# Patient Record
Sex: Male | Born: 1955 | Race: Black or African American | Hispanic: No | Marital: Married | Smoking: Never smoker
Health system: Southern US, Community
[De-identification: ages and names within clinical notes are randomized; demographics above are authoritative.]

## PROBLEM LIST (undated history)

## (undated) DIAGNOSIS — E7800 Pure hypercholesterolemia, unspecified: Secondary | ICD-10-CM

## (undated) DIAGNOSIS — I2510 Atherosclerotic heart disease of native coronary artery without angina pectoris: Secondary | ICD-10-CM

## (undated) DIAGNOSIS — I10 Essential (primary) hypertension: Secondary | ICD-10-CM

## (undated) HISTORY — PX: NISSEN FUNDOPLICATION: SHX2091

## (undated) HISTORY — PX: LUNG TRANSPLANT, DOUBLE: SHX704

## (undated) HISTORY — PX: CORONARY ANGIOPLASTY WITH STENT PLACEMENT: SHX49

---

## 2000-02-17 ENCOUNTER — Encounter: Payer: Self-pay | Admitting: Cardiovascular Disease

## 2000-02-17 ENCOUNTER — Encounter: Admission: RE | Admit: 2000-02-17 | Discharge: 2000-02-17 | Payer: Self-pay | Admitting: Cardiovascular Disease

## 2000-02-18 ENCOUNTER — Ambulatory Visit (HOSPITAL_COMMUNITY): Admission: RE | Admit: 2000-02-18 | Discharge: 2000-02-18 | Payer: Self-pay | Admitting: Cardiovascular Disease

## 2000-02-20 ENCOUNTER — Encounter: Payer: Self-pay | Admitting: Cardiovascular Disease

## 2000-02-20 ENCOUNTER — Encounter: Admission: RE | Admit: 2000-02-20 | Discharge: 2000-02-20 | Payer: Self-pay | Admitting: Cardiovascular Disease

## 2000-06-02 ENCOUNTER — Emergency Department (HOSPITAL_COMMUNITY): Admission: EM | Admit: 2000-06-02 | Discharge: 2000-06-02 | Payer: Self-pay | Admitting: Emergency Medicine

## 2000-06-02 ENCOUNTER — Encounter: Payer: Self-pay | Admitting: Emergency Medicine

## 2000-10-06 ENCOUNTER — Ambulatory Visit (HOSPITAL_COMMUNITY): Admission: RE | Admit: 2000-10-06 | Discharge: 2000-10-06 | Payer: Self-pay | Admitting: Cardiovascular Disease

## 2000-10-06 ENCOUNTER — Encounter: Payer: Self-pay | Admitting: Cardiovascular Disease

## 2001-01-23 ENCOUNTER — Emergency Department (HOSPITAL_COMMUNITY): Admission: EM | Admit: 2001-01-23 | Discharge: 2001-01-23 | Payer: Self-pay

## 2002-02-10 ENCOUNTER — Ambulatory Visit (HOSPITAL_COMMUNITY): Admission: RE | Admit: 2002-02-10 | Discharge: 2002-02-10 | Payer: Self-pay | Admitting: Pulmonary Disease

## 2002-02-10 ENCOUNTER — Encounter: Payer: Self-pay | Admitting: Pulmonary Disease

## 2004-09-05 ENCOUNTER — Emergency Department (HOSPITAL_COMMUNITY): Admission: EM | Admit: 2004-09-05 | Discharge: 2004-09-05 | Payer: Self-pay | Admitting: Emergency Medicine

## 2006-01-05 ENCOUNTER — Emergency Department (HOSPITAL_COMMUNITY): Admission: EM | Admit: 2006-01-05 | Discharge: 2006-01-05 | Payer: Self-pay | Admitting: Emergency Medicine

## 2007-02-26 ENCOUNTER — Emergency Department (HOSPITAL_COMMUNITY): Admission: EM | Admit: 2007-02-26 | Discharge: 2007-02-26 | Payer: Self-pay | Admitting: Emergency Medicine

## 2007-06-19 ENCOUNTER — Emergency Department (HOSPITAL_COMMUNITY): Admission: EM | Admit: 2007-06-19 | Discharge: 2007-06-19 | Payer: Self-pay | Admitting: Emergency Medicine

## 2010-10-06 ENCOUNTER — Emergency Department (HOSPITAL_COMMUNITY): Payer: Medicare Other

## 2010-10-06 ENCOUNTER — Emergency Department (HOSPITAL_COMMUNITY)
Admission: EM | Admit: 2010-10-06 | Discharge: 2010-10-06 | Disposition: A | Discharge status: Home / Self Care | Payer: Medicare Other | Attending: Emergency Medicine | Admitting: Emergency Medicine

## 2010-10-06 DIAGNOSIS — Z942 Lung transplant status: Secondary | ICD-10-CM | POA: Insufficient documentation

## 2010-10-06 DIAGNOSIS — M545 Low back pain, unspecified: Secondary | ICD-10-CM | POA: Insufficient documentation

## 2010-10-06 DIAGNOSIS — M549 Dorsalgia, unspecified: Secondary | ICD-10-CM | POA: Insufficient documentation

## 2010-10-06 DIAGNOSIS — Z79899 Other long term (current) drug therapy: Secondary | ICD-10-CM | POA: Insufficient documentation

## 2010-10-06 DIAGNOSIS — R000 Tachycardia, unspecified: Secondary | ICD-10-CM | POA: Insufficient documentation

## 2010-10-06 LAB — URINALYSIS, ROUTINE W REFLEX MICROSCOPIC
Bilirubin Urine: NEGATIVE
Glucose, UA: NEGATIVE mg/dL
Hgb urine dipstick: NEGATIVE
Nitrite: NEGATIVE
Urobilinogen, UA: 0.2 mg/dL (ref 0.0–1.0)
pH: 6.5 (ref 5.0–8.0)

## 2010-12-15 LAB — URINALYSIS, ROUTINE W REFLEX MICROSCOPIC
Glucose, UA: NEGATIVE
Hgb urine dipstick: NEGATIVE
Urobilinogen, UA: 0.2

## 2010-12-15 LAB — DIFFERENTIAL
Basophils Relative: 0
Eosinophils Absolute: 0
Eosinophils Relative: 0
Monocytes Relative: 10
Neutro Abs: 4.4

## 2010-12-15 LAB — COMPREHENSIVE METABOLIC PANEL
ALT: 24
AST: 29
Albumin: 4.2
Calcium: 9.5
GFR calc non Af Amer: 49 — ABNORMAL LOW
Glucose, Bld: 209 — ABNORMAL HIGH
Total Bilirubin: 0.8
Total Protein: 7.5

## 2010-12-15 LAB — CBC
HCT: 37 — ABNORMAL LOW
Hemoglobin: 13.1
MCHC: 35.4
RBC: 3.7 — ABNORMAL LOW

## 2010-12-29 LAB — BASIC METABOLIC PANEL
BUN: 23
Creatinine, Ser: 1.6 — ABNORMAL HIGH
Potassium: 4.7
Sodium: 137

## 2010-12-29 LAB — CBC
Hemoglobin: 12.3 — ABNORMAL LOW
MCHC: 35.5
Platelets: 145 — ABNORMAL LOW
RDW: 12.6
WBC: 3 — ABNORMAL LOW

## 2010-12-29 LAB — DIFFERENTIAL
Basophils Relative: 0
Eosinophils Relative: 1
Lymphs Abs: 0.5 — ABNORMAL LOW
Monocytes Relative: 11
Neutro Abs: 2.1
Neutrophils Relative %: 72

## 2012-09-06 ENCOUNTER — Other Ambulatory Visit: Payer: Self-pay | Admitting: *Deleted

## 2012-09-08 ENCOUNTER — Other Ambulatory Visit: Payer: Self-pay | Admitting: *Deleted

## 2012-09-08 DIAGNOSIS — M818 Other osteoporosis without current pathological fracture: Secondary | ICD-10-CM

## 2012-09-27 ENCOUNTER — Ambulatory Visit
Admission: RE | Admit: 2012-09-27 | Discharge: 2012-09-27 | Disposition: A | Discharge status: Home / Self Care | Payer: Self-pay | Source: Ambulatory Visit | Attending: *Deleted | Admitting: *Deleted

## 2012-09-27 DIAGNOSIS — M818 Other osteoporosis without current pathological fracture: Secondary | ICD-10-CM

## 2014-04-18 ENCOUNTER — Emergency Department (HOSPITAL_COMMUNITY): Payer: Medicare Other

## 2014-04-18 ENCOUNTER — Emergency Department (HOSPITAL_COMMUNITY)
Admission: EM | Admit: 2014-04-18 | Discharge: 2014-04-19 | Disposition: A | Discharge status: Home / Self Care | Payer: Medicare Other | Attending: Emergency Medicine | Admitting: Emergency Medicine

## 2014-04-18 ENCOUNTER — Encounter (HOSPITAL_COMMUNITY): Payer: Self-pay | Admitting: Emergency Medicine

## 2014-04-18 DIAGNOSIS — W009XXA Unspecified fall due to ice and snow, initial encounter: Secondary | ICD-10-CM | POA: Diagnosis not present

## 2014-04-18 DIAGNOSIS — S99912A Unspecified injury of left ankle, initial encounter: Secondary | ICD-10-CM | POA: Diagnosis present

## 2014-04-18 DIAGNOSIS — Y998 Other external cause status: Secondary | ICD-10-CM | POA: Insufficient documentation

## 2014-04-18 DIAGNOSIS — Y9289 Other specified places as the place of occurrence of the external cause: Secondary | ICD-10-CM | POA: Diagnosis not present

## 2014-04-18 DIAGNOSIS — Y9389 Activity, other specified: Secondary | ICD-10-CM | POA: Insufficient documentation

## 2014-04-18 DIAGNOSIS — S93402A Sprain of unspecified ligament of left ankle, initial encounter: Secondary | ICD-10-CM | POA: Insufficient documentation

## 2014-04-18 NOTE — ED Provider Notes (Signed)
CSN: [id number]     Arrival date & time 04/18/14  2239 History   First MD Initiated Contact with Patient 04/18/14 2245     Chief Complaint  Patient presents with  . Ankle Injury  . Knee Pain   Patient is a 59 y.o. male presenting with lower extremity injury and knee pain. The history is provided by the patient. No language interpreter was used.  Ankle Injury  Knee Pain  This chart was scribed for non-physician practitioner Antony MaduraKelly Jhene Westmoreland, PA-C,  working with Olivia Mackielga M Otter, MD, by Andrew Auaven Small, ED Scribe. This patient was seen in room WTR7/WTR7 and the patient's care was started at 6:03 AM.  Paulita Cradleharles A Schnoebelen is a 59 y.o. male who presents to the Emergency Department complaining of left ankle pain. Pt states he slipped on ice 2 days ago and jammed his left leg. Pt states initially he had left hip pain which has improved but began to have left ankle swelling and soreness today. He reports intermittent pins and needle sensation. No associated numbness or pallor. Pt has taken hydrocodone and robaxin at night for the pain. [facility] and was last seen earlier this month by cardiology. 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   History reviewed. No pertinent past medical history. Past Surgical History  Procedure Laterality Date  . Lung transplant, double    . Nissen fundoplication     History reviewed. No pertinent family history. History  Substance Use Topics  . Smoking status: Never Smoker   . Smokeless tobacco: Not on file  . Alcohol Use: No    Review of Systems  Musculoskeletal: Positive for myalgias and arthralgias.  All other systems reviewed and are negative.   Allergies  Review of patient's allergies indicates no known allergies.  Home Medications   Prior to Admission medications   Medication Sig Start Date End Date Taking? Authorizing Provider  HYDROcodone-acetaminophen (NORCO/VICODIN) 5-325 MG per tablet Take 1 tablet by mouth every 6 (six) hours  as needed for moderate pain or severe pain. 04/19/14   Antony MaduraKelly Halana Deisher, PA-C   BP 133/76 mmHg  Pulse 70  Temp(Src) 97.9 F (36.6 C) (Oral)  Resp 18  SpO2 100%   Physical Exam  Constitutional: He is oriented to person, place, and time. He appears well-developed and well-nourished. No distress.  Nontoxic/nonseptic appearing  HENT:  Head: Normocephalic and atraumatic.  Eyes: Conjunctivae and EOM are normal. No scleral icterus.  Neck: Normal range of motion.  Cardiovascular: Normal rate, regular rhythm and intact distal pulses.   DP and PT pulses 2+ and left lower extremity.  Pulmonary/Chest: Effort normal. No respiratory distress.  Respirations even and unlabored.  Musculoskeletal: Normal range of motion.       Left knee: Normal.       Left ankle: He exhibits swelling (Mild about the medial malleolus). He exhibits normal range of motion, no deformity and normal pulse. Tenderness. Medial malleolus tenderness found. Achilles tendon normal.  Neurological: He is alert and oriented to person, place, and time. He exhibits normal muscle tone. Coordination normal.  Sensation to light touch intact. Patient ambulatory with steady gait. Patient able to wiggle all toes of left foot.  Skin: Skin is warm and dry. No rash noted. He is not diaphoretic. No erythema. No pallor.  Psychiatric: He has a normal mood and affect. His behavior is normal.  Nursing note and vitals reviewed.   ED Course  Procedures (including critical care time) DIAGNOSTIC  STUDIES: Oxygen Saturation is 100% on RA, normal by my interpretation.    COORDINATION OF CARE: 6:03 AM- Pt advised of plan for treatment and pt agrees.  Labs Review Labs Reviewed - No data to display  Imaging Review Dg Ankle Complete Left  04/19/2014   CLINICAL DATA:  Patient slipped on ice 2 days ago and rolled the left ankle. Swelling and pain throughout the joint. Medial bruising.  EXAM: LEFT ANKLE COMPLETE - 3+ VIEW  COMPARISON:  None.  FINDINGS: There  is no evidence of fracture, dislocation, or joint effusion. 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 There is no evidence of arthropathy or other focal bone abnormality. Soft tissues are unremarkable.  IMPRESSION: Negative.   Electronically Signed   By: Burman Nieves M.D.   On: 04/19/2014 00:05     EKG Interpretation None      MDM   Final diagnoses:  Ankle sprain, left, initial encounter    59 year old nontoxic-appearing male presents to the emergency department for further evaluation after a slip without fall on ice 2 days ago. Patient states that he initially had pain in his left hip which progressed to his left knee. He states the pain in these locations have both resolved prior to arrival in the ED and he now only complains of pain in his left ankle. Patient is neurovascularly intact. No evidence of septic joint. X-ray negative for fracture or dislocation. Suspect ankle sprain as cause of medial malleolar swelling and tenderness. Patient given ASO in ED. Will manage symptoms with RICE and Norco for pain PRN. Patient advised to follow-up with his primary care doctor for further evaluation of symptoms. Return precautions provided and patient agreeable to plan with no unaddressed concerns.  I personally performed the services described in this documentation, which was scribed in my presence. The recorded information has been reviewed and is accurate.   Filed Vitals:   04/18/14 2243 04/19/14 0057  BP: 166/83 133/76  Pulse: 73 70  Temp: 98 F (36.7 C) 97.9 F (36.6 C)  TempSrc: Oral Oral  Resp: 16 18  SpO2: 100% 100%     Antony Madura, PA-C 04/19/14 9811  Olivia Mackie, MD 04/19/14 [phone]

## 2014-04-18 NOTE — ED Notes (Signed)
Patient slipped on the ice a few days ago and says he thinks he "twisted my left ankle and hurt my left knee." Took hydrocodone around 2000. No other complaints/concerns. Able to walk with steady gait.

## 2014-04-19 MED ORDER — HYDROCODONE-ACETAMINOPHEN 5-325 MG PO TABS: 1.0000 | ORAL_TABLET | Freq: Four times a day (QID) | ORAL | Status: DC | PRN

## 2014-04-19 NOTE — Discharge Instructions (Signed)
Ankle Sprain °An ankle sprain is an injury to the strong, fibrous tissues (ligaments) that hold the bones of your ankle joint together.  °CAUSES °An ankle sprain is usually caused by a fall or by twisting your ankle. Ankle sprains most commonly occur when you step on the outer edge of your foot, and your ankle turns inward. People who participate in sports are more prone to these types of injuries.  °SYMPTOMS  °· Pain in your ankle. The pain may be present at rest or only when you are trying to stand or walk. °· Swelling. °· Bruising. Bruising may develop immediately or within 1 to 2 days after your injury. °· Difficulty standing or walking, particularly when turning corners or changing directions. °DIAGNOSIS  °Your caregiver will ask you details about your injury and perform a physical exam of your ankle to determine if you have an ankle sprain. During the physical exam, your caregiver will press on and apply pressure to specific areas of your foot and ankle. Your caregiver will try to move your ankle in certain ways. An X-ray exam may be done to be sure a bone was not broken or a ligament did not separate from one of the bones in your ankle (avulsion fracture).  °TREATMENT  °Certain types of braces can help stabilize your ankle. Your caregiver can make a recommendation for this. Your caregiver may recommend the use of medicine for pain. If your sprain is severe, your caregiver may refer you to a surgeon who helps to restore function to parts of your skeletal system (orthopedist) or a physical therapist. °HOME CARE INSTRUCTIONS  °· Apply ice to your injury for 1-2 days or as directed by your caregiver. Applying ice helps to reduce inflammation and pain. °· Put ice in a plastic bag. °· Place a towel between your skin and the bag. °· Leave the ice on for 15-20 minutes at a time, every 2 hours while you are awake. °· Only take over-the-counter or prescription medicines for pain, discomfort, or fever as directed by  your caregiver. °· Elevate your injured ankle above the level of your heart as much as possible for 2-3 days. °· If your caregiver recommends crutches, use them as instructed. Gradually put weight on the affected ankle. Continue to use crutches or a cane until you can walk without feeling pain in your ankle. °· If you have a plaster splint, wear the splint as directed by your caregiver. Do not rest it on anything harder than a pillow for the first 24 hours. Do not put weight on it. Do not get it wet. You may take it off to take a shower or bath. °· You may have been given an elastic bandage to wear around your ankle to provide support. If the elastic bandage is too tight (you have numbness or tingling in your foot or your foot becomes cold and blue), adjust the bandage to make it comfortable. °· If you have an air splint, you may blow more air into it or let air out to make it more comfortable. You may take your splint off at night and before taking a shower or bath. Wiggle your toes in the splint several times per day to decrease swelling. °SEEK MEDICAL CARE IF:  °· You have rapidly increasing bruising or swelling. °· Your toes feel extremely cold or you lose feeling in your foot. °· Your pain is not relieved with medicine. °SEEK IMMEDIATE MEDICAL CARE IF: °· Your toes are numb or blue. °·   You have severe pain that is increasing. °MAKE SURE YOU:  °· Understand these instructions. °· Will watch your condition. °· Will get help right away if you are not doing well or get worse. °Document Released: 03/09/2005 Document Revised: 12/02/2011 Document Reviewed: 03/21/2011 °ExitCare® Patient Information ©2015 [employer]. This information is not intended to replace advice given to you by your health care provider. Make sure you discuss any questions you have with your health care provider. ° ° ° °RICE: Routine Care for Injuries °The routine care of many injuries includes Rest, Ice, Compression, and Elevation (RICE). °HOME  CARE INSTRUCTIONS °· Rest is needed to allow your body to heal. Routine activities can usually be resumed when comfortable. Injured tendons and bones can take up to 6 weeks to heal. Tendons are the cord-like structures that attach muscle to bone. °· Ice following an injury helps keep the swelling down and reduces pain. °¨ Put ice in a plastic bag. °¨ Place a towel between your skin and the bag. °¨ Leave the ice on for 15-20 minutes, 3-4 times a day, or as directed by your health care provider. Do this while awake, for the first 24 to 48 hours. After that, continue as directed by your caregiver. °· Compression helps keep swelling down. It also gives support and helps with discomfort. If an elastic bandage has been applied, it should be removed and reapplied every 3 to 4 hours. It should not be applied tightly, but firmly enough to keep swelling down. Watch fingers or toes for swelling, bluish discoloration, coldness, numbness, or excessive pain. If any of these problems occur, remove the bandage and reapply loosely. Contact your caregiver if these problems continue. °· Elevation helps reduce swelling and decreases pain. With extremities, such as the arms, hands, legs, and feet, the injured area should be placed near or above the level of the heart, if possible. °SEEK IMMEDIATE MEDICAL CARE IF: °· You have persistent pain and swelling. °· You develop redness, numbness, or unexpected weakness. °· Your symptoms are getting worse rather than improving after several days. °These symptoms may indicate that further evaluation or further X-rays are needed. Sometimes, X-rays may not show a small broken bone (fracture) until 1 week or 10 days later. Make a follow-up appointment with your caregiver. Ask when your X-ray results will be ready. Make sure you get your X-ray results. °Document Released: 06/21/2000 Document Revised: 03/14/2013 Document Reviewed: 08/08/2010 °ExitCare® Patient Information ©2015 [employer]. This  information is not intended to replace advice given to you by your health care provider. Make sure you discuss any questions you have with your health care provider. ° °

## 2014-06-26 ENCOUNTER — Emergency Department (HOSPITAL_COMMUNITY)
Admission: EM | Admit: 2014-06-26 | Discharge: 2014-06-27 | Disposition: A | Discharge status: Home / Self Care | Payer: Medicare Other | Attending: Emergency Medicine | Admitting: Emergency Medicine

## 2014-06-26 ENCOUNTER — Encounter (HOSPITAL_COMMUNITY): Payer: Self-pay | Admitting: Emergency Medicine

## 2014-06-26 ENCOUNTER — Emergency Department (HOSPITAL_COMMUNITY): Payer: Medicare Other

## 2014-06-26 DIAGNOSIS — Z9861 Coronary angioplasty status: Secondary | ICD-10-CM | POA: Diagnosis not present

## 2014-06-26 DIAGNOSIS — Z942 Lung transplant status: Secondary | ICD-10-CM | POA: Diagnosis not present

## 2014-06-26 DIAGNOSIS — I2510 Atherosclerotic heart disease of native coronary artery without angina pectoris: Secondary | ICD-10-CM | POA: Diagnosis not present

## 2014-06-26 DIAGNOSIS — F458 Other somatoform disorders: Secondary | ICD-10-CM | POA: Insufficient documentation

## 2014-06-26 DIAGNOSIS — R0989 Other specified symptoms and signs involving the circulatory and respiratory systems: Secondary | ICD-10-CM | POA: Diagnosis present

## 2014-06-26 LAB — COMPREHENSIVE METABOLIC PANEL
ALT: 14 U/L (ref 0–53)
AST: 23 U/L (ref 0–37)
Albumin: 3.6 g/dL (ref 3.5–5.2)
Alkaline Phosphatase: 85 U/L (ref 39–117)
Anion gap: 10 (ref 5–15)
BILIRUBIN TOTAL: 0.7 mg/dL (ref 0.3–1.2)
BUN: 19 mg/dL (ref 6–23)
CALCIUM: 9.2 mg/dL (ref 8.4–10.5)
CHLORIDE: 102 mmol/L (ref 96–112)
CO2: 25 mmol/L (ref 19–32)
CREATININE: 1.39 mg/dL — AB (ref 0.50–1.35)
GFR calc Af Amer: 62 mL/min — ABNORMAL LOW (ref >90)
GFR calc non Af Amer: 54 mL/min — ABNORMAL LOW (ref >90)
Glucose, Bld: 131 mg/dL — ABNORMAL HIGH (ref 70–99)
Potassium: 3.8 mmol/L (ref 3.5–5.1)
Sodium: 137 mmol/L (ref 135–145)
Total Protein: 8.3 g/dL (ref 6.0–8.3)

## 2014-06-26 LAB — CBC WITH DIFFERENTIAL/PLATELET
BASOS ABS: 0 10*3/uL (ref 0.0–0.1)
BASOS PCT: 0 % (ref 0–1)
EOS ABS: 0.1 10*3/uL (ref 0.0–0.7)
EOS PCT: 1 % (ref 0–5)
HEMATOCRIT: 32.9 % — AB (ref 39.0–52.0)
HEMOGLOBIN: 11.2 g/dL — AB (ref 13.0–17.0)
LYMPHS ABS: 1.2 10*3/uL (ref 0.7–4.0)
Lymphocytes Relative: 25 % (ref 12–46)
MCH: 33 pg (ref 26.0–34.0)
MCHC: 34 g/dL (ref 30.0–36.0)
MCV: 97.1 fL (ref 78.0–100.0)
MONOS PCT: 12 % (ref 3–12)
Monocytes Absolute: 0.6 10*3/uL (ref 0.1–1.0)
NEUTROS ABS: 3 10*3/uL (ref 1.7–7.7)
NEUTROS PCT: 62 % (ref 43–77)
Platelets: 122 10*3/uL — ABNORMAL LOW (ref 150–400)
RBC: 3.39 MIL/uL — AB (ref 4.22–5.81)
RDW: 12.3 % (ref 11.5–15.5)
WBC: 4.8 10*3/uL (ref 4.0–10.5)

## 2014-06-26 LAB — I-STAT CHEM 8, ED
BUN: 19 mg/dL (ref 6–23)
CALCIUM ION: 1.17 mmol/L (ref 1.13–1.30)
CHLORIDE: 102 mmol/L (ref 96–112)
Creatinine, Ser: 1.3 mg/dL (ref 0.50–1.35)
GLUCOSE: 116 mg/dL — AB (ref 70–99)
HCT: 31 % — ABNORMAL LOW (ref 39.0–52.0)
Hemoglobin: 10.5 g/dL — ABNORMAL LOW (ref 13.0–17.0)
Potassium: 3.9 mmol/L (ref 3.5–5.1)
SODIUM: 140 mmol/L (ref 135–145)
TCO2: 24 mmol/L (ref 0–100)

## 2014-06-26 LAB — PROTIME-INR
INR: 0.96 (ref 0.00–1.49)
Prothrombin Time: 12.9 seconds (ref 11.6–15.2)

## 2014-06-26 LAB — TROPONIN I: Troponin I: <0.03 ng/mL (ref <0.031)

## 2014-06-26 MED ORDER — GI COCKTAIL ~~LOC~~
30.0000 mL | Freq: Once | ORAL | Status: AC
  Administered 2014-06-26: 30 mL via ORAL
  Filled 2014-06-26: qty 30

## 2014-06-26 NOTE — ED Notes (Signed)
Bed: [id number] Expected date: 06/26/14 Expected time: 7:41 PM Means of arrival: Ambulance Comments: Foreign body in throat

## 2014-06-26 NOTE — ED Notes (Addendum)
Pt was eating chicken tonight and started choking. Hx of narrow esophagus. Near syncopal episode after coughing to get food up. Was able to spit chicken out but feels like some may still be stuck. Airway intact. Breath sounds equal. Hx of bilateral lung transplant. Had a stent placed yesterday. Alert and oriented.

## 2014-06-26 NOTE — ED Provider Notes (Signed)
CSN: [id number]     Arrival date & time 06/26/14  1951 History   First MD Initiated Contact with Patient 06/26/14 2002     Chief Complaint  Patient presents with  . Foreign Body     (Consider location/radiation/quality/duration/timing/severity/associated sxs/prior Treatment) HPI Comments:  59 y.o. male with a history of lung transplant, coronary artery disease status post stent yesterday who presents with choking sensation which began around 7 PM this evening while he was on the phone. He states that he had eaten dried chicken earlier. He felt a sensation of his throat and felt like he was going to choke and almost pass out. He notes that his symptoms have slowly resolved since then but he does still feel the sensation in his throat. He is able to tolerate liquid. He denies any chest pain or shortness of breath. The symptoms are constant in nature. The severity is mild. He is not had some other symptoms previously. Nothing is really just symptoms thus far.  Patient is a 59 y.o. male presenting with foreign body. The history is provided by the patient.  Foreign Body Associated symptoms: no abdominal pain, no cough, no drooling, no nausea, no rhinorrhea and no vomiting     History reviewed. No pertinent past medical history. Past Surgical History  Procedure Laterality Date  . Lung transplant, double    . Nissen fundoplication     History reviewed. No pertinent family history. History  Substance Use Topics  . Smoking status: Never Smoker   . Smokeless tobacco: Not on file  . Alcohol Use: No    Review of Systems  Constitutional: Negative for fever.  HENT: Negative for drooling and rhinorrhea.   Eyes: Negative for pain.  Respiratory: Negative for cough and shortness of breath.   Cardiovascular: Negative for chest pain and leg swelling.  Gastrointestinal: Negative for nausea, vomiting, abdominal pain and diarrhea.  Genitourinary: Negative for dysuria and hematuria.  Musculoskeletal:  Negative for gait problem and neck pain.  Skin: Negative for color change.  Neurological: Negative for numbness and headaches.       Near syncope  Hematological: Negative for adenopathy.  Psychiatric/Behavioral: Negative for behavioral problems.  All other systems reviewed and are negative.     Allergies  Review of patient's allergies indicates no known allergies.  Home Medications   Prior to Admission medications   Medication Sig Start Date End Date Taking? Authorizing Provider  HYDROcodone-acetaminophen (NORCO/VICODIN) 5-325 MG per tablet Take 1 tablet by mouth every 6 (six) hours as needed for moderate pain or severe pain. 04/19/14   Antony Madura, PA-C   BP 147/66 mmHg  Pulse 90  Temp(Src) 98 F (36.7 C) (Oral)  SpO2 100% Physical Exam  Constitutional: He is oriented to person, place, and time. He appears well-developed and well-nourished.  HENT:  Head: Normocephalic and atraumatic.  Right Ear: External ear normal.  Left Ear: External ear normal.  Nose: Nose normal.  Mouth/Throat: Oropharynx is clear and moist. No oropharyngeal exudate.  Eyes: Conjunctivae and EOM are normal. Pupils are equal, round, and reactive to light.  Neck: Normal range of motion. Neck supple.  Cardiovascular: Normal rate, regular rhythm, normal heart sounds and intact distal pulses.  Exam reveals no gallop and no friction rub.   No murmur heard. Pulmonary/Chest: Effort normal and breath sounds normal. No respiratory distress. He has no wheezes.  Abdominal: Soft. Bowel sounds are normal. He exhibits no distension. There is no tenderness. There is no rebound and no guarding.  Musculoskeletal: Normal range of motion. He exhibits no edema or tenderness.  Neurological: He is alert and oriented to person, place, and time.  Skin: Skin is warm and dry.  Psychiatric: He has a normal mood and affect. His behavior is normal.  Nursing note and vitals reviewed.   ED Course  Procedures (including critical  care time) Labs Review Labs Reviewed  CBC WITH DIFFERENTIAL/PLATELET - Abnormal; Notable for the following:    RBC 3.39 (*)    Hemoglobin 11.2 (*)    HCT 32.9 (*)    Platelets 122 (*)    All other components within normal limits  COMPREHENSIVE METABOLIC PANEL - Abnormal; Notable for the following:    Glucose, Bld 131 (*)    Creatinine, Ser 1.39 (*)    GFR calc non Af Amer 54 (*)    GFR calc Af Amer 62 (*)    All other components within normal limits  TROPONIN I  PROTIME-INR  I-STAT TROPOININ, ED    Imaging Review Dg Chest 2 View  06/26/2014   CLINICAL DATA:  Choking on piece of chicken, cough, near syncope, history of bilateral lung transplant  EXAM: CHEST  2 VIEW  COMPARISON:  10/06/2010  FINDINGS: Postsurgical changes related to bilateral lung transplant with chronic pleural fluid/thickening at the left lung base. No pneumothorax.  No radiopaque foreign body is seen.  The heart is normal in size.  Mild degenerative changes of the visualized thoracolumbar spine.  IMPRESSION: No evidence of acute cardiopulmonary disease.  No radiopaque foreign body is seen.   Electronically Signed   By: Charline Bills M.D.   On: 06/26/2014 21:20     EKG Interpretation   Date/Time:  Tuesday June 26 2014 20:26:54 EDT Ventricular Rate:  94 PR Interval:  176 QRS Duration: 94 QT Interval:  353 QTC Calculation: 441 R Axis:   -18 Text Interpretation:  Sinus rhythm Abnormal R-wave progression, early  transition Left ventricular hypertrophy Confirmed by Khup Sapia  MD, Kato Wieczorek  ([id number]) on 06/26/2014 9:00:00 PM      MDM   Final diagnoses:  Globus sensation    9:00 PM 59 y.o. male with a history of lung transplant, coronary artery disease status post stent yesterday who presents with choking sensation which began around 7 PM this evening while he was on the phone. He states that he had eaten dried chicken earlier. He felt a sensation of his throat and felt like he was going to choke and almost pass  out. He notes that his symptoms have slowly resolved since then but he does still feel the sensation in his throat. He is able to tolerate liquid. He denies any chest pain or shortness of breath. He is afebrile and vital signs are unremarkable here. We'll get screening labs and imaging.  Case discussed w/ Dr. Marcelino Scot ([facility] pulm txplt) and Dr. Neva Seat ([facility] cards fellow). [facility] is currently on diversion and after discussing the case with Dr. Neva Seat we agreed his symptoms were likely GI related. If a stent complication was present it would likely be more obvious clinically and in his workup. I recommended observation here and he felt like that was reasonable. Given his low acuity, we did not think transfer to [facility] was needed. I discussed admission here for obs with the patient but he refuses. He states that he is currently asymptomatic and would prefer to go home. I do not think this is unreasonable as his symptoms have completely resolved but I did recommend at least 1 more  troponin prior to discharge. He agreed.  12:10 AM: Pt continues to appear well. Remains asx. Refuses admission. I have discussed the diagnosis/risks/treatment options with the patient and family and believe the pt to be eligible for discharge home to follow-up with his cardiologist tomorrow. We also discussed returning to the ED immediately if new or worsening sx occur. We discussed the sx which are most concerning (e.g., return of sx, cp, sob) that necessitate immediate return. Medications administered to the patient during their visit and any new prescriptions provided to the patient are listed below.  Medications given during this visit Medications  gi cocktail (Maalox,Lidocaine,Donnatal) (30 mLs Oral Given 06/26/14 2214)    New Prescriptions   No medications on file     Purvis SheffieldForrest Lynzi Meulemans, MD 06/27/14 0012

## 2014-06-26 NOTE — Progress Notes (Signed)
EDCM spoke to patient and family member at bedside.  Patient confirms he has [employer] Medicare insurance.  Patient [facility], but patient does not have a pcp.  [facility] with [facility] who accept [employer] Medicare insurnace within a ten mile radius of patient's zip code [phone].  Patient reports he has been looking for a pcp, but most of them are not accepting new patients at this time.  Patient thankful for resources.  No further EDCM needs at this time.

## 2014-06-27 LAB — I-STAT TROPONIN, ED: TROPONIN I, POC: 0 ng/mL (ref 0.00–0.08)

## 2014-08-23 ENCOUNTER — Ambulatory Visit (HOSPITAL_COMMUNITY): Payer: Medicare Other

## 2014-08-30 ENCOUNTER — Inpatient Hospital Stay (HOSPITAL_COMMUNITY): Admission: RE | Admit: 2014-08-30 | Payer: Medicare Other | Source: Ambulatory Visit

## 2014-09-03 ENCOUNTER — Encounter (HOSPITAL_COMMUNITY): Payer: Medicare Other

## 2014-09-05 ENCOUNTER — Encounter (HOSPITAL_COMMUNITY): Payer: Medicare Other

## 2014-09-10 ENCOUNTER — Encounter (HOSPITAL_COMMUNITY): Payer: Medicare Other

## 2014-09-12 ENCOUNTER — Encounter (HOSPITAL_COMMUNITY): Payer: Medicare Other

## 2014-09-13 ENCOUNTER — Encounter (HOSPITAL_COMMUNITY)
Admission: RE | Admit: 2014-09-13 | Discharge: 2014-09-13 | Disposition: A | Discharge status: Home / Self Care | Payer: Medicare Other | Source: Ambulatory Visit | Attending: Cardiology | Admitting: Cardiology

## 2014-09-13 DIAGNOSIS — Z48812 Encounter for surgical aftercare following surgery on the circulatory system: Secondary | ICD-10-CM | POA: Insufficient documentation

## 2014-09-13 DIAGNOSIS — Z955 Presence of coronary angioplasty implant and graft: Secondary | ICD-10-CM | POA: Insufficient documentation

## 2014-09-13 NOTE — Progress Notes (Signed)
Cardiac Rehab Medication Review by a Pharmacist  Does the patient  feel that his/her medications are working for him/her?  yes  Has the patient been experiencing any side effects to the medications prescribed?  No. Was on a medication that caused him to itch but no longer taking.  Does the patient measure his/her own blood pressure or blood glucose at home?  Yes. Reports normal BP at home.   Does the patient have any problems obtaining medications due to transportation or finances?   no  Understanding of regimen: good Understanding of indications: good Potential of compliance: good    Pharmacist comments:  Pt reports compliance with all of their medications and no issues today.     Armandina Stammer 09/13/2014 8:41 AM

## 2014-09-17 ENCOUNTER — Encounter (HOSPITAL_COMMUNITY): Payer: Medicare Other

## 2014-09-19 ENCOUNTER — Encounter (HOSPITAL_COMMUNITY)
Admission: RE | Admit: 2014-09-19 | Discharge: 2014-09-19 | Disposition: A | Discharge status: Home / Self Care | Payer: Medicare Other | Source: Ambulatory Visit | Attending: Cardiology | Admitting: Cardiology

## 2014-09-19 ENCOUNTER — Encounter (HOSPITAL_COMMUNITY): Payer: Medicare Other

## 2014-09-19 DIAGNOSIS — Z955 Presence of coronary angioplasty implant and graft: Secondary | ICD-10-CM | POA: Diagnosis not present

## 2014-09-19 DIAGNOSIS — Z48812 Encounter for surgical aftercare following surgery on the circulatory system: Secondary | ICD-10-CM | POA: Diagnosis not present

## 2014-09-19 NOTE — Progress Notes (Signed)
Pt started cardiac rehab today.  Pt tolerated light exercise without difficulty. VSS, telemetry- Sinus , asymptomatic.  Medication list reconciled.  Pt verbalized compliance with medications and denies barriers to compliance. PSYCHOSOCIAL ASSESSMENT:  PHQ-0. Pt exhibits positive coping skills, hopeful outlook with supportive family. No psychosocial needs identified at this time, no psychosocial interventions necessary.    Pt enjoys watching movies and walking.   Pt cardiac rehab  goal is  to stay in shape.  Pt encouraged to participate in exercising on your own  to increase ability to achieve these goals.   Pt long term cardiac rehab goal is maintain a healthy lifestyle and getting into a routine of exercise.  Pt oriented to exercise equipment and routine.  Understanding verbalized.

## 2014-09-21 ENCOUNTER — Encounter (HOSPITAL_COMMUNITY)
Admission: RE | Admit: 2014-09-21 | Discharge: 2014-09-21 | Disposition: A | Discharge status: Home / Self Care | Payer: Medicare Other | Source: Ambulatory Visit | Attending: Cardiology | Admitting: Cardiology

## 2014-09-21 DIAGNOSIS — Z955 Presence of coronary angioplasty implant and graft: Secondary | ICD-10-CM | POA: Insufficient documentation

## 2014-09-21 DIAGNOSIS — Z48812 Encounter for surgical aftercare following surgery on the circulatory system: Secondary | ICD-10-CM | POA: Insufficient documentation

## 2014-09-26 ENCOUNTER — Encounter (HOSPITAL_COMMUNITY): Payer: Medicare Other

## 2014-09-26 ENCOUNTER — Encounter (HOSPITAL_COMMUNITY)
Admission: RE | Admit: 2014-09-26 | Discharge: 2014-09-26 | Disposition: A | Discharge status: Home / Self Care | Payer: Medicare Other | Source: Ambulatory Visit | Attending: Cardiology | Admitting: Cardiology

## 2014-09-26 DIAGNOSIS — Z48812 Encounter for surgical aftercare following surgery on the circulatory system: Secondary | ICD-10-CM | POA: Diagnosis not present

## 2014-09-26 NOTE — Progress Notes (Signed)
QUALITY OF LIFE SCORE REVIEW Patient scored in the following overall 28.88, healthy and function 29.20, socioeconomic 26.79,physical and spiritual 29.64, family 30 .  Scores less than 21 are considered low.  Patient scores are well above the low threshold.  Pt exhibits positive coping skills and feels supported by his family.   Will continue to monitor and intervene as necessary.  Alanson Alyarlette Carlton RN, BSN

## 2014-09-28 ENCOUNTER — Encounter (HOSPITAL_COMMUNITY)
Admission: RE | Admit: 2014-09-28 | Discharge: 2014-09-28 | Disposition: A | Discharge status: Home / Self Care | Payer: Medicare Other | Source: Ambulatory Visit | Attending: Cardiology | Admitting: Cardiology

## 2014-09-28 DIAGNOSIS — Z48812 Encounter for surgical aftercare following surgery on the circulatory system: Secondary | ICD-10-CM | POA: Diagnosis not present

## 2014-09-28 NOTE — Progress Notes (Signed)
Reviewed home exercise with pt today.  Pt plans to walk and use treadmill at home for exercise.  Reviewed THR, pulse, RPE, sign and symptoms, NTG use, and when to call 911 or MD.  Pt voiced understanding. Averlee Swartz, MA, ACSM RCEP  

## 2014-10-01 ENCOUNTER — Encounter (HOSPITAL_COMMUNITY): Payer: Medicare Other

## 2014-10-03 ENCOUNTER — Encounter (HOSPITAL_COMMUNITY): Payer: Medicare Other

## 2014-10-03 ENCOUNTER — Encounter (HOSPITAL_COMMUNITY)
Admission: RE | Admit: 2014-10-03 | Discharge: 2014-10-03 | Disposition: A | Discharge status: Home / Self Care | Payer: Medicare Other | Source: Ambulatory Visit | Attending: Cardiology | Admitting: Cardiology

## 2014-10-03 DIAGNOSIS — Z48812 Encounter for surgical aftercare following surgery on the circulatory system: Secondary | ICD-10-CM | POA: Diagnosis not present

## 2014-10-05 ENCOUNTER — Encounter (HOSPITAL_COMMUNITY): Payer: Medicare Other

## 2014-10-05 ENCOUNTER — Encounter (HOSPITAL_COMMUNITY)
Admission: RE | Admit: 2014-10-05 | Discharge: 2014-10-05 | Disposition: A | Discharge status: Home / Self Care | Payer: Medicare Other | Source: Ambulatory Visit | Attending: Cardiology | Admitting: Cardiology

## 2014-10-05 DIAGNOSIS — Z48812 Encounter for surgical aftercare following surgery on the circulatory system: Secondary | ICD-10-CM | POA: Diagnosis not present

## 2014-10-08 ENCOUNTER — Encounter (HOSPITAL_COMMUNITY): Payer: Medicare Other

## 2014-10-09 ENCOUNTER — Telehealth (HOSPITAL_COMMUNITY): Payer: Self-pay | Admitting: *Deleted

## 2014-10-10 ENCOUNTER — Encounter (HOSPITAL_COMMUNITY): Payer: Medicare Other

## 2014-10-12 ENCOUNTER — Encounter (HOSPITAL_COMMUNITY): Payer: Medicare Other

## 2014-10-15 ENCOUNTER — Encounter (HOSPITAL_COMMUNITY): Payer: Medicare Other

## 2014-10-17 ENCOUNTER — Encounter (HOSPITAL_COMMUNITY): Payer: Medicare Other

## 2014-10-17 ENCOUNTER — Encounter (HOSPITAL_COMMUNITY)
Admission: RE | Admit: 2014-10-17 | Discharge: 2014-10-17 | Disposition: A | Discharge status: Home / Self Care | Payer: Medicare Other | Source: Ambulatory Visit | Attending: Cardiology | Admitting: Cardiology

## 2014-10-17 DIAGNOSIS — Z48812 Encounter for surgical aftercare following surgery on the circulatory system: Secondary | ICD-10-CM | POA: Diagnosis not present

## 2014-10-19 ENCOUNTER — Encounter (HOSPITAL_COMMUNITY)
Admission: RE | Admit: 2014-10-19 | Discharge: 2014-10-19 | Disposition: A | Discharge status: Home / Self Care | Payer: Medicare Other | Source: Ambulatory Visit | Attending: Cardiology | Admitting: Cardiology

## 2014-10-19 DIAGNOSIS — Z48812 Encounter for surgical aftercare following surgery on the circulatory system: Secondary | ICD-10-CM | POA: Diagnosis not present

## 2014-10-22 ENCOUNTER — Encounter (HOSPITAL_COMMUNITY): Payer: Medicare Other

## 2014-10-24 ENCOUNTER — Encounter (HOSPITAL_COMMUNITY): Payer: Medicare Other

## 2014-10-24 ENCOUNTER — Encounter (HOSPITAL_COMMUNITY)
Admission: RE | Admit: 2014-10-24 | Discharge: 2014-10-24 | Disposition: A | Discharge status: Home / Self Care | Payer: Medicare Other | Source: Ambulatory Visit | Attending: Cardiology | Admitting: Cardiology

## 2014-10-24 DIAGNOSIS — Z955 Presence of coronary angioplasty implant and graft: Secondary | ICD-10-CM | POA: Insufficient documentation

## 2014-10-24 DIAGNOSIS — Z48812 Encounter for surgical aftercare following surgery on the circulatory system: Secondary | ICD-10-CM | POA: Insufficient documentation

## 2014-10-26 ENCOUNTER — Encounter (HOSPITAL_COMMUNITY)
Admission: RE | Admit: 2014-10-26 | Discharge: 2014-10-26 | Disposition: A | Discharge status: Home / Self Care | Payer: Medicare Other | Source: Ambulatory Visit | Attending: Cardiology | Admitting: Cardiology

## 2014-10-26 DIAGNOSIS — Z48812 Encounter for surgical aftercare following surgery on the circulatory system: Secondary | ICD-10-CM | POA: Diagnosis not present

## 2014-10-26 NOTE — Progress Notes (Signed)
Pt graduated from cardiac rehab program today with completion of 10 exercise sessions in Phase II. Pt opted to attend CR for a shorter period of time due to insurance benefits/copay. Pt maintained good attendance and progressed nicely during his participation in rehab as evidenced by increased MET level. Pt increased from 2.5 to 4.2.   Medication list reconciled. Repeat  PHQ score-0  .  Pt has made significant lifestyle changes and should be commended for his success. Pt feels he has made progress toward his goals during cardiac rehab.    Pt desired to maintain healthy lifestyle, stay in shape and develop routine in exercise. Pt feels he has learned the tools that are necessary for him to be successful. Pt plans to continue exercise treadmill three days a week.  Advised pt to increase to four days a week because of the recommendation by the exercise specialist for continued heart health.  Pt was amenable to this advise.  It was a delight to have this patient participate in cardiac rehab. Cherre Huger, BSN

## 2014-11-23 ENCOUNTER — Emergency Department (HOSPITAL_COMMUNITY)
Admission: EM | Admit: 2014-11-23 | Discharge: 2014-11-23 | Disposition: A | Discharge status: Home / Self Care | Payer: Medicare Other | Attending: Emergency Medicine | Admitting: Emergency Medicine

## 2014-11-23 ENCOUNTER — Emergency Department (HOSPITAL_COMMUNITY): Payer: Medicare Other

## 2014-11-23 ENCOUNTER — Encounter (HOSPITAL_COMMUNITY): Payer: Self-pay | Admitting: *Deleted

## 2014-11-23 DIAGNOSIS — Z7952 Long term (current) use of systemic steroids: Secondary | ICD-10-CM | POA: Diagnosis not present

## 2014-11-23 DIAGNOSIS — Z942 Lung transplant status: Secondary | ICD-10-CM | POA: Insufficient documentation

## 2014-11-23 DIAGNOSIS — Z79899 Other long term (current) drug therapy: Secondary | ICD-10-CM | POA: Insufficient documentation

## 2014-11-23 DIAGNOSIS — J019 Acute sinusitis, unspecified: Secondary | ICD-10-CM | POA: Diagnosis not present

## 2014-11-23 DIAGNOSIS — Z7982 Long term (current) use of aspirin: Secondary | ICD-10-CM | POA: Diagnosis not present

## 2014-11-23 DIAGNOSIS — R51 Headache: Secondary | ICD-10-CM | POA: Diagnosis present

## 2014-11-23 DIAGNOSIS — E780 Pure hypercholesterolemia: Secondary | ICD-10-CM | POA: Insufficient documentation

## 2014-11-23 DIAGNOSIS — Z7902 Long term (current) use of antithrombotics/antiplatelets: Secondary | ICD-10-CM | POA: Insufficient documentation

## 2014-11-23 DIAGNOSIS — Z9861 Coronary angioplasty status: Secondary | ICD-10-CM | POA: Insufficient documentation

## 2014-11-23 DIAGNOSIS — I2510 Atherosclerotic heart disease of native coronary artery without angina pectoris: Secondary | ICD-10-CM | POA: Diagnosis not present

## 2014-11-23 DIAGNOSIS — E876 Hypokalemia: Secondary | ICD-10-CM | POA: Diagnosis not present

## 2014-11-23 HISTORY — DX: Pure hypercholesterolemia, unspecified: E78.00

## 2014-11-23 HISTORY — DX: Atherosclerotic heart disease of native coronary artery without angina pectoris: I25.10

## 2014-11-23 LAB — CBC WITH DIFFERENTIAL/PLATELET
BASOS PCT: 1 % (ref 0–1)
Basophils Absolute: 0 10*3/uL (ref 0.0–0.1)
Eosinophils Absolute: 0.1 10*3/uL (ref 0.0–0.7)
Eosinophils Relative: 2 % (ref 0–5)
HEMATOCRIT: 34.1 % — AB (ref 39.0–52.0)
HEMOGLOBIN: 12.1 g/dL — AB (ref 13.0–17.0)
LYMPHS PCT: 36 % (ref 12–46)
Lymphs Abs: 1.4 10*3/uL (ref 0.7–4.0)
MCH: 34.3 pg — ABNORMAL HIGH (ref 26.0–34.0)
MCHC: 35.5 g/dL (ref 30.0–36.0)
MCV: 96.6 fL (ref 78.0–100.0)
MONOS PCT: 16 % — AB (ref 3–12)
Monocytes Absolute: 0.6 10*3/uL (ref 0.1–1.0)
NEUTROS ABS: 1.8 10*3/uL (ref 1.7–7.7)
NEUTROS PCT: 46 % (ref 43–77)
Platelets: 106 10*3/uL — ABNORMAL LOW (ref 150–400)
RBC: 3.53 MIL/uL — ABNORMAL LOW (ref 4.22–5.81)
RDW: 13.1 % (ref 11.5–15.5)
WBC: 4 10*3/uL (ref 4.0–10.5)

## 2014-11-23 LAB — URINALYSIS, ROUTINE W REFLEX MICROSCOPIC
Bilirubin Urine: NEGATIVE
Glucose, UA: NEGATIVE mg/dL
Hgb urine dipstick: NEGATIVE
Ketones, ur: NEGATIVE mg/dL
Leukocytes, UA: NEGATIVE
Nitrite: NEGATIVE
Protein, ur: NEGATIVE mg/dL
SPECIFIC GRAVITY, URINE: 1.005 (ref 1.005–1.030)
UROBILINOGEN UA: 0.2 mg/dL (ref 0.0–1.0)
pH: 7 (ref 5.0–8.0)

## 2014-11-23 LAB — BASIC METABOLIC PANEL
Anion gap: 7 (ref 5–15)
BUN: 19 mg/dL (ref 6–20)
CHLORIDE: 101 mmol/L (ref 101–111)
CO2: 28 mmol/L (ref 22–32)
Calcium: 9.2 mg/dL (ref 8.9–10.3)
Creatinine, Ser: 1.45 mg/dL — ABNORMAL HIGH (ref 0.61–1.24)
GFR calc non Af Amer: 51 mL/min — ABNORMAL LOW (ref >60)
GFR, EST AFRICAN AMERICAN: 59 mL/min — AB (ref >60)
Glucose, Bld: 109 mg/dL — ABNORMAL HIGH (ref 65–99)
Potassium: 3.4 mmol/L — ABNORMAL LOW (ref 3.5–5.1)
Sodium: 136 mmol/L (ref 135–145)

## 2014-11-23 MED ORDER — LEVOFLOXACIN 750 MG PO TABS
750.0000 mg | ORAL_TABLET | Freq: Once | ORAL | Status: AC
  Administered 2014-11-23: 750 mg via ORAL
  Filled 2014-11-23: qty 1

## 2014-11-23 MED ORDER — SODIUM CHLORIDE 0.9 % IV BOLUS (SEPSIS)
500.0000 mL | Freq: Once | INTRAVENOUS | Status: AC
  Administered 2014-11-23: 500 mL via INTRAVENOUS

## 2014-11-23 MED ORDER — POTASSIUM CHLORIDE CRYS ER 20 MEQ PO TBCR
20.0000 meq | EXTENDED_RELEASE_TABLET | Freq: Once | ORAL | Status: AC
  Administered 2014-11-23: 20 meq via ORAL
  Filled 2014-11-23: qty 1

## 2014-11-23 MED ORDER — LEVOFLOXACIN 750 MG PO TABS: 750.0000 mg | ORAL_TABLET | Freq: Every day | ORAL | Status: DC

## 2014-11-23 NOTE — Discharge Instructions (Signed)
Sinusitis °Sinusitis is redness, soreness, and inflammation of the paranasal sinuses. Paranasal sinuses are air pockets within the bones of your face (beneath the eyes, the middle of the forehead, or above the eyes). In healthy paranasal sinuses, mucus is able to drain out, and air is able to circulate through them by way of your nose. However, when your paranasal sinuses are inflamed, mucus and air can become trapped. This can allow bacteria and other germs to grow and cause infection. °Sinusitis can develop quickly and last only a short time (acute) or continue over a long period (chronic). Sinusitis that lasts for more than 12 weeks is considered chronic.  °CAUSES  °Causes of sinusitis include: °· Allergies. °· Structural abnormalities, such as displacement of the cartilage that separates your nostrils (deviated septum), which can decrease the air flow through your nose and sinuses and affect sinus drainage. °· Functional abnormalities, such as when the small hairs (cilia) that line your sinuses and help remove mucus do not work properly or are not present. °SIGNS AND SYMPTOMS  °Symptoms of acute and chronic sinusitis are the same. The primary symptoms are pain and pressure around the affected sinuses. Other symptoms include: °· Upper toothache. °· Earache. °· Headache. °· Bad breath. °· Decreased sense of smell and taste. °· A cough, which worsens when you are lying flat. °· Fatigue. °· Fever. °· Thick drainage from your nose, which often is green and may contain pus (purulent). °· Swelling and warmth over the affected sinuses. °DIAGNOSIS  °Your health care provider will perform a physical exam. During the exam, your health care provider may: °· Look in your nose for signs of abnormal growths in your nostrils (nasal polyps). °· Tap over the affected sinus to check for signs of infection. °· View the inside of your sinuses (endoscopy) using an imaging device that has a light attached (endoscope). °If your health  care provider suspects that you have chronic sinusitis, one or more of the following tests may be recommended: °· Allergy tests. °· Nasal culture. A sample of mucus is taken from your nose, sent to a lab, and screened for bacteria. °· Nasal cytology. A sample of mucus is taken from your nose and examined by your health care provider to determine if your sinusitis is related to an allergy. °TREATMENT  °Most cases of acute sinusitis are related to a viral infection and will resolve on their own within 10 days. Sometimes medicines are prescribed to help relieve symptoms (pain medicine, decongestants, nasal steroid sprays, or saline sprays).  °However, for sinusitis related to a bacterial infection, your health care provider will prescribe antibiotic medicines. These are medicines that will help kill the bacteria causing the infection.  °Rarely, sinusitis is caused by a fungal infection. In theses cases, your health care provider will prescribe antifungal medicine. °For some cases of chronic sinusitis, surgery is needed. Generally, these are cases in which sinusitis recurs more than 3 times per year, despite other treatments. °HOME CARE INSTRUCTIONS  °· Drink plenty of water. Water helps thin the mucus so your sinuses can drain more easily. °· Use a humidifier. °· Inhale steam 3 to 4 times a day (for example, sit in the bathroom with the shower running). °· Apply a warm, moist washcloth to your face 3 to 4 times a day, or as directed by your health care provider. °· Use saline nasal sprays to help moisten and clean your sinuses. °· Take medicines only as directed by your health care provider. °·   If you were prescribed either an antibiotic or antifungal medicine, finish it all even if you start to feel better. SEEK IMMEDIATE MEDICAL CARE IF:  You have increasing pain or severe headaches.  You have nausea, vomiting, or drowsiness.  You have swelling around your face.  You have vision problems.  You have a stiff  neck.  You have difficulty breathing. MAKE SURE YOU:   Understand these instructions.  Will watch your condition.  Will get help right away if you are not doing well or get worse. Document Released: 03/09/2005 Document Revised: 07/24/2013 Document Reviewed: 03/24/2011 [facility] Patient Information 2015 [place], [place]. This information is not intended to replace advice given to you by your health care provider. Make sure you discuss any questions you have with your health care provider.  Hypokalemia Hypokalemia means that the amount of potassium in the blood is lower than normal.Potassium is a chemical, called an electrolyte, that helps regulate the amount of fluid in the body. It also stimulates muscle contraction and helps nerves function properly.Most of the body's potassium is inside of cells, and only a very small amount is in the blood. Because the amount in the blood is so small, minor changes can be life-threatening. CAUSES  Antibiotics.  Diarrhea or vomiting.  Using laxatives too much, which can cause diarrhea.  Chronic kidney disease.  Water pills (diuretics).  Eating disorders (bulimia).  Low magnesium level.  Sweating a lot. SIGNS AND SYMPTOMS  Weakness.  Constipation.  Fatigue.  Muscle cramps.  Mental confusion.  Skipped heartbeats or irregular heartbeat (palpitations).  Tingling or numbness. DIAGNOSIS  Your health care provider can diagnose hypokalemia with blood tests. In addition to checking your potassium level, your health care provider may also check other lab tests. TREATMENT Hypokalemia can be treated with potassium supplements taken by mouth or adjustments in your current medicines. If your potassium level is very low, you may need to get potassium through a vein (IV) and be monitored in the hospital. A diet high in potassium is also helpful. Foods high in potassium are:  Nuts, such as peanuts and pistachios.  Seeds, such as sunflower seeds  and pumpkin seeds.  Peas, lentils, and lima beans.  Whole grain and bran cereals and breads.  Fresh fruit and vegetables, such as apricots, avocado, bananas, cantaloupe, kiwi, oranges, tomatoes, asparagus, and potatoes.  Orange and tomato juices.  Red meats.  Fruit yogurt. HOME CARE INSTRUCTIONS  Take all medicines as prescribed by your health care provider.  Maintain a healthy diet by including nutritious food, such as fruits, vegetables, nuts, whole grains, and lean meats.  If you are taking a laxative, be sure to follow the directions on the label. SEEK MEDICAL CARE IF:  Your weakness gets worse.  You feel your heart pounding or racing.  You are vomiting or having diarrhea.  You are diabetic and having trouble keeping your blood glucose in the normal range. SEEK IMMEDIATE MEDICAL CARE IF:  You have chest pain, shortness of breath, or dizziness.  You are vomiting or having diarrhea for more than 2 days.  You faint. MAKE SURE YOU:   Understand these instructions.  Will watch your condition.  Will get help right away if you are not doing well or get worse. Document Released: 03/09/2005 Document Revised: 12/28/2012 Document Reviewed: 09/09/2012 [facility] Patient Information 2015 [place], [place]. This information is not intended to replace advice given to you by your health care provider. Make sure you discuss any questions you have with your  health care provider. ° °

## 2014-11-23 NOTE — ED Notes (Signed)
Patient transported to X-ray 

## 2014-11-23 NOTE — ED Provider Notes (Signed)
CSN: [id number]     Arrival date & time 11/23/14  1057 History   First MD Initiated Contact with Patient 11/23/14 1123     Chief Complaint  Patient presents with  . Headache  . Facial Pain  . Fatigue   (Consider location/radiation/quality/duration/timing/severity/associated sxs/prior Treatment) HPI   Patient is a 59 year old male with past medical history of double lung transplant, CAD, high cholesterol, who presents to the emergency Department with complaints of headache, right-sided facial pain, and fatigue. He states that he takes daily Claritin and he forgot a dosed 2 days ago, and after being out in the heat, later that night did not feel well, went to bed early and woke up with a right-sided headache, located over his left temple and sinuses, with some sensation of drainage from his right eye over right sinuses down into his lungs.  His headache has spontaneously improved before presentation to the ED, however he is still concerned about the sensation of sinus drainage.  He called his physician at [facility] who suggested he reports the nearest ER. He does have a history of sinus congestion for the past several years, uses Claritin daily.  He denies any fever, chills, sweats, cough, wheeze, SOB, CP, abdominal pain, N, V, D, visual changes, eye pain, eye redness, eye drainage, hearing changes, ST, ear pain, dizziness, tingling, syncope, aphasia, dysphagia, facial asymmetry, slurred speech, muscle weakness.  He denies any sick contacts.   Past Medical History  Diagnosis Date  . Coronary artery disease   . High cholesterol    Past Surgical History  Procedure Laterality Date  . Lung transplant, double    . Nissen fundoplication    . Coronary angioplasty with stent placement     History reviewed. No pertinent family history. Social History  Substance Use Topics  . Smoking status: Never Smoker   . Smokeless tobacco: None  . Alcohol Use: No    Review of Systems  Constitutional: Positive  for fatigue. Negative for fever, chills, diaphoresis and appetite change.  HENT: Positive for congestion, postnasal drip, rhinorrhea and sinus pressure. Negative for dental problem, sore throat and trouble swallowing.   Eyes: Negative.  Negative for pain, discharge, redness and visual disturbance.  Respiratory: Positive for chest tightness. Negative for cough, shortness of breath, wheezing and stridor.   Cardiovascular: Negative.   Gastrointestinal: Negative.   Genitourinary: Negative.   Musculoskeletal: Negative.   Skin: Negative.   Neurological: Positive for light-headedness and headaches. Negative for syncope, facial asymmetry and numbness.  Psychiatric/Behavioral: Negative.    Allergies  Review of patient's allergies indicates no known allergies.  Home Medications   Prior to Admission medications   Medication Sig Start Date End Date Taking? Authorizing Provider  aspirin 81 MG chewable tablet Chew 81 mg by mouth daily.   Yes Historical Provider, MD  atorvastatin (LIPITOR) 20 MG tablet Take 20 mg by mouth daily.   Yes Historical Provider, MD  brimonidine (ALPHAGAN P) 0.1 % SOLN Place 2 drops into both eyes Nightly.   Yes Historical Provider, MD  calcium-vitamin D (OSCAL WITH D) 250-125 MG-UNIT per tablet Take 2 tablets by mouth 2 (two) times daily.   Yes Historical Provider, MD  cholecalciferol (VITAMIN D) 1000 UNITS tablet Take 2,000 Units by mouth daily.   Yes Historical Provider, MD  clopidogrel (PLAVIX) 75 MG tablet Take 75 mg by mouth daily.   Yes Historical Provider, MD  clotrimazole (LOTRIMIN) 1 % cream Apply 1 application topically 2 (two) times daily as needed (rash.).  Yes Historical Provider, MD  cyclobenzaprine (FLEXERIL) 5 MG tablet Take 5 mg by mouth 3 (three) times daily as needed for muscle spasms.   Yes Historical Provider, MD  ferrous sulfate 325 (65 FE) MG tablet Take 325 mg by mouth 2 (two) times daily with a meal.   Yes Historical Provider, MD  ibandronate (BONIVA)  150 MG tablet Take 150 mg by mouth every 30 (thirty) days. Take in the morning with a full glass of water, on an empty stomach, and do not take anything else by mouth or lie down for the next 30 min.   Yes Historical Provider, MD  loratadine (CLARITIN) 10 MG tablet Take 10 mg by mouth daily as needed for allergies.   Yes Historical Provider, MD  magnesium oxide (MAG-OX) 400 MG tablet Take 800 mg by mouth 2 (two) times daily.   Yes Historical Provider, MD  Multiple Vitamin (MULTIVITAMIN WITH MINERALS) TABS tablet Take 1 tablet by mouth daily.   Yes Historical Provider, MD  predniSONE (DELTASONE) 5 MG tablet Take 5 mg by mouth daily with breakfast.   Yes Historical Provider, MD  tacrolimus (PROGRAF) 1 MG capsule Take 6-7 mg by mouth 2 (two) times daily. Take 7 mg every morning and 6 mg every night.   Yes Historical Provider, MD  tamsulosin (FLOMAX) 0.4 MG CAPS capsule Take 0.4 mg by mouth daily after supper.   Yes Historical Provider, MD  HYDROcodone-acetaminophen (NORCO/VICODIN) 5-325 MG per tablet Take 1 tablet by mouth every 6 (six) hours as needed for moderate pain or severe pain. Patient not taking: Reported on 10/26/2014 04/19/14   Antony Madura, PA-C  levofloxacin (LEVAQUIN) 750 MG tablet Take 1 tablet (750 mg total) by mouth daily. 11/23/14   [name], PA-C  nitroGLYCERIN (NITROSTAT) 0.4 MG SL tablet Place 0.4 mg under the tongue every 5 (five) minutes as needed for chest pain.    Historical Provider, MD   BP 142/72 mmHg  Pulse 82  Temp(Src) 98 F (36.7 C) (Oral)  Resp 28  SpO2 99% Physical Exam  Constitutional: He is oriented to person, place, and time. Vital signs are normal. He appears well-developed and well-nourished. He is cooperative.  Non-toxic appearance. No distress.  Chronically ill, thin male, appears older than stated age, NAD, laying in ER gurney  HENT:  Head: Normocephalic and atraumatic.  Right Ear: Tympanic membrane, external ear and ear canal normal. No mastoid tenderness.  Tympanic membrane is not injected.  Left Ear: Tympanic membrane, external ear and ear canal normal. No mastoid tenderness. Tympanic membrane is not injected.  Nose: No rhinorrhea. No epistaxis. Right sinus exhibits maxillary sinus tenderness and frontal sinus tenderness. Left sinus exhibits no maxillary sinus tenderness and no frontal sinus tenderness.  Mouth/Throat: Uvula is midline, oropharynx is clear and moist and mucous membranes are normal. Mucous membranes are not pale, not dry and not cyanotic. No oropharyngeal exudate, posterior oropharyngeal edema or posterior oropharyngeal erythema.  Erythematous nasal mucosa  Eyes: Conjunctivae and EOM are normal. Pupils are equal, round, and reactive to light. Right eye exhibits no discharge. Left eye exhibits no discharge. No scleral icterus.  Neck: Trachea normal, normal range of motion and full passive range of motion without pain. No JVD present. No tracheal tenderness present. No tracheal deviation and no edema present. No thyromegaly present.  Cardiovascular: Normal rate, regular rhythm, normal heart sounds, intact distal pulses and normal pulses.  Exam reveals no gallop and no friction rub.   No murmur heard. Pulmonary/Chest: Effort normal  and breath sounds normal. No accessory muscle usage or stridor. No tachypnea. No respiratory distress. He has no decreased breath sounds. He has no wheezes. He has no rhonchi. He has no rales. He exhibits no tenderness, no bony tenderness and no crepitus.  Abdominal: Soft. Bowel sounds are normal. He exhibits no distension and no mass. There is no tenderness. There is no rebound and no guarding.  Musculoskeletal: Normal range of motion. He exhibits no edema or tenderness.  Lymphadenopathy:    He has no cervical adenopathy.  Neurological: He is alert and oriented to person, place, and time. He has normal reflexes. No cranial nerve deficit. He exhibits normal muscle tone. Coordination normal.  Skin: Skin is warm  and dry. No rash noted. He is not diaphoretic. No erythema. No pallor.  Psychiatric: He has a normal mood and affect. His behavior is normal. Judgment and thought content normal.  Nursing note and vitals reviewed.   ED Course  Procedures (including critical care time) Labs Review Labs Reviewed  CBC WITH DIFFERENTIAL/PLATELET - Abnormal; Notable for the following:    RBC 3.53 (*)    Hemoglobin 12.1 (*)    HCT 34.1 (*)    MCH 34.3 (*)    Platelets 106 (*)    Monocytes Relative 16 (*)    All other components within normal limits  BASIC METABOLIC PANEL - Abnormal; Notable for the following:    Potassium 3.4 (*)    Glucose, Bld 109 (*)    Creatinine, Ser 1.45 (*)    GFR calc non Af Amer 51 (*)    GFR calc Af Amer 59 (*)    All other components within normal limits  URINALYSIS, ROUTINE W REFLEX MICROSCOPIC (NOT AT [facility])    Imaging Review Dg Chest 2 View  11/23/2014   CLINICAL DATA:  59 year old male with congestion, lightheadedness, shortness of breath. History of bilateral lung transplant. Initial encounter.  EXAM: CHEST  2 VIEW  COMPARISON:  06/26/2014 and earlier.  FINDINGS: Stable lung volumes, chronically decreased on the left with unchanged blunting of the costophrenic angle. Stable cardiac size and mediastinal contours. No pneumothorax or pulmonary edema. No definite pleural effusion. No acute pulmonary opacity. No acute osseous abnormality identified. Visualized bowel gas pattern within normal limits.  IMPRESSION: Stable postoperative appearance of the chest. No acute cardiopulmonary abnormality.   Electronically Signed   By: Odessa Fleming M.D.   On: 11/23/2014 13:17   Ct Head Wo Contrast  11/23/2014   CLINICAL DATA:  Dizziness.  Right-sided head numbness  EXAM: CT HEAD WITHOUT CONTRAST  TECHNIQUE: Contiguous axial images were obtained from the base of the skull through the vertex without intravenous contrast.  COMPARISON:  None.  FINDINGS: The ventricles are normal in size and  configuration. There is no intracranial mass, hemorrhage, extra-axial fluid collection, or midline shift. There is patchy small vessel disease in the supratentorial white matter, most notably in the posterior corona radiata bilaterally, slightly more on the right than on the left. No acute appearing infarct evident. Elsewhere gray-white compartments appear normal. Bony calvarium appears intact. Mastoid air cells are clear.  IMPRESSION: Patchy supratentorial small vessel disease. No acute infarct evident. No hemorrhage or mass effect.   Electronically Signed   By: Bretta Bang III M.D.   On: 11/23/2014 13:42   I have personally reviewed and evaluated these images and lab results as part of my medical decision-making.   EKG Interpretation None      MDM   Final diagnoses:  Acute sinusitis, recurrence not specified, unspecified location  Hypokalemia   Pt with sx consistent with sinusitis x 1-2 days, is a double lung transplant pt, sent to ED for evaluation by transplant pulmonologist-  Work up initiated to r/o acute pathology in head or chest with immunocompromised pt - workup is negative, patient is not orthostatic, vitals have been stable, he is afebrile, workup is pertinent for mild hypokalemia, mildly elevated creatinine, otherwise no acute pathology and VSS.  I suspect that he was out in the sun too long yesterday and has some sx of dehydration, with a acute viral URI.  His nasal mucosa is not consistent with allergic rhinitis, however, pt states that he feels his claritin "kicking in" and is feeling much improved since he arrived in the ED.  Pt was given 20 mEq of potassium replacement, 1/2 L IVF.  His pulmonologist, Dr. Anne Shutter, of [facility], was consulted for treatment recommendations.  She recommends 10ds of levaquin and pt is to contact his transplant coordinator with any concerning sx over the weekend or early next week for follow-up.   Medications  sodium chloride 0.9 % bolus 500 mL  (0 mLs Intravenous Stopped 11/23/14 1359)  levofloxacin (LEVAQUIN) tablet 750 mg (750 mg Oral Given 11/23/14 1501)  potassium chloride SA (K-DUR,KLOR-CON) CR tablet 20 mEq (20 mEq Oral Given 11/23/14 1500)   Filed Vitals:   11/23/14 1245 11/23/14 1400 11/23/14 1430 11/23/14 1500  BP: 142/72 136/79 138/73 138/73  Pulse: 82 83 76 82  Temp:      TempSrc:      Resp: 28 14 13 26   SpO2: 99% 100% 100% 100%      [name], PA-C 11/26/14 0912  Margarita Grizzle, MD 12/02/14 [id number]

## 2014-11-23 NOTE — ED Notes (Signed)
Pt reports onset yesterday of right side sinus headache and congestion, feels like sinuses are draining down back of throat. Also having fatigue. Denies recent cough or fever.

## 2015-06-15 ENCOUNTER — Emergency Department (HOSPITAL_COMMUNITY): Payer: Medicare Other

## 2015-06-15 ENCOUNTER — Encounter (HOSPITAL_COMMUNITY): Payer: Self-pay | Admitting: Emergency Medicine

## 2015-06-15 ENCOUNTER — Emergency Department (HOSPITAL_COMMUNITY)
Admission: EM | Admit: 2015-06-15 | Discharge: 2015-06-15 | Disposition: A | Discharge status: Home / Self Care | Payer: Medicare Other | Attending: Emergency Medicine | Admitting: Emergency Medicine

## 2015-06-15 DIAGNOSIS — E7800 Pure hypercholesterolemia, unspecified: Secondary | ICD-10-CM | POA: Insufficient documentation

## 2015-06-15 DIAGNOSIS — J069 Acute upper respiratory infection, unspecified: Secondary | ICD-10-CM | POA: Insufficient documentation

## 2015-06-15 DIAGNOSIS — Z79899 Other long term (current) drug therapy: Secondary | ICD-10-CM | POA: Insufficient documentation

## 2015-06-15 DIAGNOSIS — I2510 Atherosclerotic heart disease of native coronary artery without angina pectoris: Secondary | ICD-10-CM | POA: Diagnosis not present

## 2015-06-15 DIAGNOSIS — Z9861 Coronary angioplasty status: Secondary | ICD-10-CM | POA: Insufficient documentation

## 2015-06-15 DIAGNOSIS — R0602 Shortness of breath: Secondary | ICD-10-CM | POA: Diagnosis present

## 2015-06-15 DIAGNOSIS — Z7982 Long term (current) use of aspirin: Secondary | ICD-10-CM | POA: Insufficient documentation

## 2015-06-15 DIAGNOSIS — Z7902 Long term (current) use of antithrombotics/antiplatelets: Secondary | ICD-10-CM | POA: Diagnosis not present

## 2015-06-15 DIAGNOSIS — Z7952 Long term (current) use of systemic steroids: Secondary | ICD-10-CM | POA: Diagnosis not present

## 2015-06-15 LAB — BASIC METABOLIC PANEL
Anion gap: 8 (ref 5–15)
BUN: 26 mg/dL — AB (ref 6–20)
CALCIUM: 9 mg/dL (ref 8.9–10.3)
CO2: 26 mmol/L (ref 22–32)
CREATININE: 1.21 mg/dL (ref 0.61–1.24)
Chloride: 104 mmol/L (ref 101–111)
GFR calc non Af Amer: >60 mL/min (ref >60)
Glucose, Bld: 108 mg/dL — ABNORMAL HIGH (ref 65–99)
Potassium: 4.8 mmol/L (ref 3.5–5.1)
Sodium: 138 mmol/L (ref 135–145)

## 2015-06-15 LAB — I-STAT TROPONIN, ED: TROPONIN I, POC: 0 ng/mL (ref 0.00–0.08)

## 2015-06-15 LAB — CBC WITH DIFFERENTIAL/PLATELET
BASOS PCT: 1 %
Basophils Absolute: 0 10*3/uL (ref 0.0–0.1)
EOS ABS: 0.2 10*3/uL (ref 0.0–0.7)
Eosinophils Relative: 6 %
HCT: 32.8 % — ABNORMAL LOW (ref 39.0–52.0)
Hemoglobin: 11.3 g/dL — ABNORMAL LOW (ref 13.0–17.0)
LYMPHS ABS: 2 10*3/uL (ref 0.7–4.0)
Lymphocytes Relative: 57 %
MCH: 33.7 pg (ref 26.0–34.0)
MCHC: 34.5 g/dL (ref 30.0–36.0)
MCV: 97.9 fL (ref 78.0–100.0)
MONO ABS: 0.5 10*3/uL (ref 0.1–1.0)
Monocytes Relative: 16 %
NEUTROS ABS: 0.7 10*3/uL — AB (ref 1.7–7.7)
NEUTROS PCT: 20 %
PLATELETS: 94 10*3/uL — AB (ref 150–400)
RBC: 3.35 MIL/uL — ABNORMAL LOW (ref 4.22–5.81)
RDW: 12.4 % (ref 11.5–15.5)
WBC: 3.4 10*3/uL — ABNORMAL LOW (ref 4.0–10.5)

## 2015-06-15 LAB — BRAIN NATRIURETIC PEPTIDE: B Natriuretic Peptide: 52.3 pg/mL (ref 0.0–100.0)

## 2015-06-15 NOTE — ED Notes (Signed)
Pt arrives by POV with c/o SOB, tingling on left side (arm). Pt went to bed last night and woke up feeling like someone was suffocating him. Pt states tingling has happened. Pt states he was sick last Friday (aches, cough, congestion, and sinus drainage). Drainage has continued since feeling sick. Breathing improves when pt ambulates.

## 2015-06-15 NOTE — Discharge Instructions (Signed)
Upper Respiratory Infection, Adult Most upper respiratory infections (URIs) are a viral infection of the air passages leading to the lungs. A URI affects the nose, throat, and upper air passages. The most common type of URI is nasopharyngitis and is typically referred to as "the common cold." URIs run their course and usually go away on their own. Most of the time, a URI does not require medical attention, but sometimes a bacterial infection in the upper airways can follow a viral infection. This is called a secondary infection. Sinus and middle ear infections are common types of secondary upper respiratory infections. Bacterial pneumonia can also complicate a URI. A URI can worsen asthma and chronic obstructive pulmonary disease (COPD). Sometimes, these complications can require emergency medical care and may be life threatening.  CAUSES Almost all URIs are caused by viruses. A virus is a type of germ and can spread from one person to another.  RISKS FACTORS You may be at risk for a URI if:   You smoke.   You have chronic heart or lung disease.  You have a weakened defense (immune) system.   You are very young or very old.   You have nasal allergies or asthma.  You work in crowded or poorly ventilated areas.  You work in health care facilities or schools. SIGNS AND SYMPTOMS  Symptoms typically develop 2-3 days after you come in contact with a cold virus. Most viral URIs last 7-10 days. However, viral URIs from the influenza virus (flu virus) can last 14-18 days and are typically more severe. Symptoms may include:   Runny or stuffy (congested) nose.   Sneezing.   Cough.   Sore throat.   Headache.   Fatigue.   Fever.   Loss of appetite.   Pain in your forehead, behind your eyes, and over your cheekbones (sinus pain).  Muscle aches.  DIAGNOSIS  Your health care provider may diagnose a URI by:  Physical exam.  Tests to check that your symptoms are not due to  another condition such as:  Strep throat.  Sinusitis.  Pneumonia.  Asthma. TREATMENT  A URI goes away on its own with time. It cannot be cured with medicines, but medicines may be prescribed or recommended to relieve symptoms. Medicines may help:  Reduce your fever.  Reduce your cough.  Relieve nasal congestion. HOME CARE INSTRUCTIONS   Take medicines only as directed by your health care provider.   Gargle warm saltwater or take cough drops to comfort your throat as directed by your health care provider.  Use a warm mist humidifier or inhale steam from a shower to increase air moisture. This may make it easier to breathe.  Drink enough fluid to keep your urine clear or pale yellow.   Eat soups and other clear broths and maintain good nutrition.   Rest as needed.   Return to work when your temperature has returned to normal or as your health care provider advises. You may need to stay home longer to avoid infecting others. You can also use a face mask and careful hand washing to prevent spread of the virus.  Increase the usage of your inhaler if you have asthma.   Do not use any tobacco products, including cigarettes, chewing tobacco, or electronic cigarettes. If you need help quitting, ask your health care provider. PREVENTION  The best way to protect yourself from getting a cold is to practice good hygiene.   Avoid oral or hand contact with people with cold   symptoms.   Wash your hands often if contact occurs.  There is no clear evidence that vitamin C, vitamin E, echinacea, or exercise reduces the chance of developing a cold. However, it is always recommended to get plenty of rest, exercise, and practice good nutrition.  SEEK MEDICAL CARE IF:   You are getting worse rather than better.   Your symptoms are not controlled by medicine.   You have chills.  You have worsening shortness of breath.  You have brown or red mucus.  You have yellow or brown nasal  discharge.  You have pain in your face, especially when you bend forward.  You have a fever.  You have swollen neck glands.  You have pain while swallowing.  You have white areas in the back of your throat. SEEK IMMEDIATE MEDICAL CARE IF:   You have severe or persistent:  Headache.  Ear pain.  Sinus pain.  Chest pain.  You have chronic lung disease and any of the following:  Wheezing.  Prolonged cough.  Coughing up blood.  A change in your usual mucus.  You have a stiff neck.  You have changes in your:  Vision.  Hearing.  Thinking.  Mood. MAKE SURE YOU:   Understand these instructions.  Will watch your condition.  Will get help right away if you are not doing well or get worse.   This information is not intended to replace advice given to you by your health care provider. Make sure you discuss any questions you have with your health care provider.   Document Released: 09/02/2000 Document Revised: 07/24/2014 Document Reviewed: 06/14/2013 Elsevier Interactive Patient Education 2016 [employer]  

## 2015-06-15 NOTE — ED Notes (Signed)
Phlebotomy at the bedside  

## 2015-06-15 NOTE — ED Notes (Signed)
MD Belfi at the bedside  

## 2015-06-15 NOTE — ED Notes (Signed)
Pt returned from X-ray.  

## 2015-06-15 NOTE — ED Provider Notes (Signed)
CSN: [id number]     Arrival date & time 06/15/15  [id number] History   First MD Initiated Contact with Patient 06/15/15 0700     Chief Complaint  Patient presents with  . Shortness of Breath     (Consider location/radiation/quality/duration/timing/severity/associated sxs/prior Treatment) HPI Comments: Patient presents with shortness of breath. He has a history of a lung [facility] that was [facility]. He's followed by the [facility] transplant team. He states a week ago he started having some nasal congestion and mild coughing. He had a 1-2 day history of fevers and chills. He hasn't had any known fevers or chills in the last 5-6 days. He states he's had a lot of nasal congestion and drainage nose. He states over the last 2-3 days he has felt short of breath at night. He feels like when he lays down all the drainage runs down the back of the throat and he chokes on it. He states he feels better during the daytime when he is up walking around. He denies any chest pain. He did have some tingling in his left arm during coughing spells but no other discomfort. He initially was using Flonase without improvement of symptoms. He switch to Mucinex and he states his cough improved but he still has a smothering feeling at night when he is trying to sleep. He hasn't slept well over the last 2 nights.  He denies any leg swelling. He denies any significant coughing or productive cough.  Patient is a 60 y.o. male presenting with shortness of breath.  Shortness of Breath Associated symptoms: cough   Associated symptoms: no abdominal pain, no chest pain, no diaphoresis, no fever, no headaches, no rash and no vomiting     Past Medical History  Diagnosis Date  . Coronary artery disease   . High cholesterol    Past Surgical History  Procedure Laterality Date  . Lung transplant, double    . Nissen fundoplication    . Coronary angioplasty with stent placement     History reviewed. No pertinent family  history. Social History  Substance Use Topics  . Smoking status: Never Smoker   . Smokeless tobacco: None  . Alcohol Use: No    Review of Systems  Constitutional: Negative for fever, chills, diaphoresis and fatigue.  HENT: Positive for congestion, postnasal drip and rhinorrhea. Negative for sneezing.   Eyes: Negative.   Respiratory: Positive for cough and shortness of breath. Negative for chest tightness.   Cardiovascular: Negative for chest pain and leg swelling.  Gastrointestinal: Negative for nausea, vomiting, abdominal pain, diarrhea and blood in stool.  Genitourinary: Negative for frequency, hematuria, flank pain and difficulty urinating.  Musculoskeletal: Negative for back pain and arthralgias.  Skin: Negative for rash.  Neurological: Negative for dizziness, speech difficulty, weakness, numbness and headaches.      Allergies  Review of patient's allergies indicates no known allergies.  Home Medications   Prior to Admission medications   Medication Sig Start Date End Date Taking? Authorizing Provider  aspirin 81 MG chewable tablet Chew 81 mg by mouth daily.   Yes Historical Provider, MD  atorvastatin (LIPITOR) 20 MG tablet Take 20 mg by mouth daily.   Yes Historical Provider, MD  calcium-vitamin D (OSCAL WITH D) 250-125 MG-UNIT per tablet Take 2 tablets by mouth 2 (two) times daily.   Yes Historical Provider, MD  clopidogrel (PLAVIX) 75 MG tablet Take 75 mg by mouth daily.   Yes Historical Provider, MD  cyclobenzaprine (FLEXERIL) 5  MG tablet Take 5 mg by mouth 3 (three) times daily as needed for muscle spasms.   Yes Historical Provider, MD  ezetimibe (ZETIA) 10 MG tablet Take 10 mg by mouth daily.   Yes Historical Provider, MD  ferrous sulfate 325 (65 FE) MG tablet Take 325 mg by mouth 2 (two) times daily with a meal.   Yes Historical Provider, MD  guaiFENesin (MUCINEX) 600 MG 12 hr tablet Take 600 mg by mouth 2 (two) times daily as needed for cough or to loosen phlegm.    Yes Historical Provider, MD  ibandronate (BONIVA) 150 MG tablet Take 150 mg by mouth every 30 (thirty) days. Take in the morning with a full glass of water, on an empty stomach, and do not take anything else by mouth or lie down for the next 30 min.   Yes Historical Provider, MD  latanoprost (XALATAN) 0.005 % ophthalmic solution Place 2 drops into both eyes at bedtime.   Yes Historical Provider, MD  metoprolol tartrate (LOPRESSOR) 25 MG tablet Take 25 mg by mouth 2 (two) times daily.   Yes Historical Provider, MD  Multiple Vitamin (MULTIVITAMIN WITH MINERALS) TABS tablet Take 1 tablet by mouth daily.   Yes Historical Provider, MD  Multiple Vitamins-Minerals (PRESERVISION AREDS 2 PO) Take 1 tablet by mouth daily.   Yes Historical Provider, MD  nitroGLYCERIN (NITROSTAT) 0.4 MG SL tablet Place 0.4 mg under the tongue every 5 (five) minutes as needed for chest pain.   Yes Historical Provider, MD  predniSONE (DELTASONE) 5 MG tablet Take 5 mg by mouth daily with breakfast.   Yes Historical Provider, MD  sulfamethoxazole-trimethoprim (BACTRIM,SEPTRA) 400-80 MG tablet Take 1 tablet by mouth daily.   Yes Historical Provider, MD  tacrolimus (PROGRAF) 1 MG capsule Take 6-7 mg by mouth 2 (two) times daily. Take 7 mg every morning and 6 mg every night.   Yes Historical Provider, MD  HYDROcodone-acetaminophen (NORCO/VICODIN) 5-325 MG per tablet Take 1 tablet by mouth every 6 (six) hours as needed for moderate pain or severe pain. Patient not taking: Reported on 10/26/2014 04/19/14   Antony Madura, PA-C  levofloxacin (LEVAQUIN) 750 MG tablet Take 1 tablet (750 mg total) by mouth daily. Patient not taking: Reported on 06/15/2015 11/23/14   [name], PA-C   BP 131/84 mmHg  Pulse 72  Temp(Src) 98 F (36.7 C) (Oral)  Resp 23  SpO2 100% Physical Exam  Constitutional: He is oriented to person, place, and time. He appears well-developed and well-nourished.  HENT:  Head: Normocephalic and atraumatic.  Right Ear:  External ear normal.  Left Ear: External ear normal.  Mouth/Throat: Oropharynx is clear and moist.  Eyes: Pupils are equal, round, and reactive to light.  Neck: Normal range of motion. Neck supple.  Cardiovascular: Normal rate, regular rhythm and normal heart sounds.   Pulmonary/Chest: Effort normal and breath sounds normal. No respiratory distress. He has no wheezes. He has no rales. He exhibits no tenderness.  Abdominal: Soft. Bowel sounds are normal. There is no tenderness. There is no rebound and no guarding.  Musculoskeletal: Normal range of motion. He exhibits no edema.  Lymphadenopathy:    He has no cervical adenopathy.  Neurological: He is alert and oriented to person, place, and time.  Skin: Skin is warm and dry. No rash noted.  Psychiatric: He has a normal mood and affect.    ED Course  Procedures (including critical care time) Labs Review Labs Reviewed  BASIC METABOLIC PANEL - Abnormal; Notable for  the following:    Glucose, Bld 108 (*)    BUN 26 (*)    All other components within normal limits  CBC WITH DIFFERENTIAL/PLATELET - Abnormal; Notable for the following:    WBC 3.4 (*)    RBC 3.35 (*)    Hemoglobin 11.3 (*)    HCT 32.8 (*)    Platelets 94 (*)    Neutro Abs 0.7 (*)    All other components within normal limits  BRAIN NATRIURETIC PEPTIDE  I-STAT TROPOININ, ED    Imaging Review Dg Chest 2 View  06/15/2015  CLINICAL DATA:  Shortness of breath, fever and chills for the past 2 days. History of lung transplantation (2002). EXAM: CHEST  2 VIEW COMPARISON:  11/23/2014; 06/26/2014; 10/06/2010 FINDINGS: Grossly unchanged cardiac silhouette and mediastinal contours. Stable positioning of median sternotomy wires and postsurgical change of the bilateral hila compatible with provided history of prior lung transplantation. Chronic blunting of the bilateral costophrenic angles, left greater than right, unchanged. Slightly nodular heterogeneous opacities within the right mid  lung are unchanged. No new focal airspace opacities. No evidence of edema. No pneumothorax. No acute osseous abnormalities. IMPRESSION: 1.  No acute cardiopulmonary disease. 2. 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 Electronically Signed   By: Simonne Come M.D.   On: 06/15/2015 08:13   I have personally reviewed and evaluated these images and lab results as part of my medical decision-making.   EKG Interpretation   Date/Time:  Saturday June 15 2015 06:41:04 EDT Ventricular Rate:  79 PR Interval:  156 QRS Duration: 91 QT Interval:  385 QTC Calculation: 441 R Axis:   -33 Text Interpretation:  Sinus rhythm Consider left atrial enlargement Left  ventricular hypertrophy since last tracing no significant change Confirmed  by Kellye Mizner  MD, Devyn Griffing ([id number]) on 06/15/2015 8:08:40 AM      MDM   Final diagnoses:  URI (upper respiratory infection)    Patient presents with congestion and smothering feeling at night only. He doesn't have any evidence of pneumonia or pulmonary edema. He's afebrile. He's otherwise well-appearing. He's asymptomatic during the day. I feel symptoms are related to postnasal drainage which makes him feel like he can't breathe due to the congestion in his throat. I encouraged him to continue taking Mucinex. I encouraged him to sleep raised up on pillows. I spoke with his transplant coordinator at [facility] who is comfortable with the patient being discharged home. They will follow up with the patient on Monday which is 2 days from now. Patient is given strict return precautions. He has some mild pancytopenia which is slightly more prominent than our last lab values. I did notify the transplant coordinator of this.   Rolan Bucco, MD 06/15/15 1041

## 2015-06-17 LAB — PATHOLOGIST SMEAR REVIEW

## 2015-11-28 ENCOUNTER — Ambulatory Visit (HOSPITAL_COMMUNITY)
Admission: EM | Admit: 2015-11-28 | Discharge: 2015-11-28 | Disposition: A | Discharge status: Home / Self Care | Payer: Medicare Other | Attending: Internal Medicine | Admitting: Internal Medicine

## 2015-11-28 ENCOUNTER — Ambulatory Visit (INDEPENDENT_AMBULATORY_CARE_PROVIDER_SITE_OTHER): Payer: Medicare Other

## 2015-11-28 ENCOUNTER — Encounter (HOSPITAL_COMMUNITY): Payer: Self-pay | Admitting: Emergency Medicine

## 2015-11-28 DIAGNOSIS — R229 Localized swelling, mass and lump, unspecified: Secondary | ICD-10-CM

## 2015-11-28 DIAGNOSIS — S59902A Unspecified injury of left elbow, initial encounter: Secondary | ICD-10-CM

## 2015-11-28 NOTE — Discharge Instructions (Signed)
°  The nodule you feel under your skin is likely due to the recent trauma from door hitting your elbow.  It is harmless and may eventually go away on its own, or it may stay there forever. As long as it does not get bigger, red, or painful, you do not need to have any additional care of it.  If it does become painful, red, getting larger, or other new concerning symptoms develop, please follow up with your primary care provider for a recheck.

## 2015-11-28 NOTE — ED Triage Notes (Signed)
Patient says 2 days ago he was getting out of his car, door came back against his arm .  patient did not think much about this initially.  Since then has noticed some soreness in left elbow and there is a small knot on elbow that has not been there before.  Patient has good range of motion in this joint.

## 2015-11-28 NOTE — ED Provider Notes (Signed)
CSN: [id number]     Arrival date & time 11/28/15  1824 History   First MD Initiated Contact with Patient 11/28/15 2006     Chief Complaint  Patient presents with  . Elbow Injury   (Consider location/radiation/quality/duration/timing/severity/associated sxs/prior Treatment) HPI CRISTOFHER LIVECCHI is a 60 y.o. male presenting to UC with c/o Left elbow irritation and small "bump" to lateral aspect.  Symptoms started 2 days ago after his Left elbow got hit by his car door as he was getting out.  He reports minimal pain but is concerned the knot might be a broken bone. Denies limitation to his range of motion.  He has not taken any pain medication PTA. Denies numbness, tingling or weakness or Left arm.  Pt is Right hand dominant.     Past Medical History:  Diagnosis Date  . Coronary artery disease   . High cholesterol    Past Surgical History:  Procedure Laterality Date  . CORONARY ANGIOPLASTY WITH STENT PLACEMENT    . LUNG TRANSPLANT, DOUBLE    . NISSEN FUNDOPLICATION     No family history on file. Social History  Substance Use Topics  . Smoking status: Never Smoker  . Smokeless tobacco: Never Used  . Alcohol use No    Review of Systems  Musculoskeletal: Positive for arthralgias and joint swelling ("knot"). Negative for myalgias.  Skin: Negative for color change, rash and wound.  Neurological: Negative for weakness and numbness.    Allergies  Review of patient's allergies indicates no known allergies.  Home Medications   Prior to Admission medications   Medication Sig Start Date End Date Taking? Authorizing Provider  aspirin 81 MG chewable tablet Chew 81 mg by mouth daily.    Historical Provider, MD  atorvastatin (LIPITOR) 20 MG tablet Take 20 mg by mouth daily.    Historical Provider, MD  calcium-vitamin D (OSCAL WITH D) 250-125 MG-UNIT per tablet Take 2 tablets by mouth 2 (two) times daily.    Historical Provider, MD  clopidogrel (PLAVIX) 75 MG tablet Take 75 mg by mouth  daily.    Historical Provider, MD  cyclobenzaprine (FLEXERIL) 5 MG tablet Take 5 mg by mouth 3 (three) times daily as needed for muscle spasms.    Historical Provider, MD  ezetimibe (ZETIA) 10 MG tablet Take 10 mg by mouth daily.    Historical Provider, MD  ferrous sulfate 325 (65 FE) MG tablet Take 325 mg by mouth 2 (two) times daily with a meal.    Historical Provider, MD  guaiFENesin (MUCINEX) 600 MG 12 hr tablet Take 600 mg by mouth 2 (two) times daily as needed for cough or to loosen phlegm.    Historical Provider, MD  HYDROcodone-acetaminophen (NORCO/VICODIN) 5-325 MG per tablet Take 1 tablet by mouth every 6 (six) hours as needed for moderate pain or severe pain. Patient not taking: Reported on 10/26/2014 04/19/14   Antony Madura, PA-C  ibandronate (BONIVA) 150 MG tablet Take 150 mg by mouth every 30 (thirty) days. Take in the morning with a full glass of water, on an empty stomach, and do not take anything else by mouth or lie down for the next 30 min.    Historical Provider, MD  latanoprost (XALATAN) 0.005 % ophthalmic solution Place 2 drops into both eyes at bedtime.    Historical Provider, MD  levofloxacin (LEVAQUIN) 750 MG tablet Take 1 tablet (750 mg total) by mouth daily. Patient not taking: Reported on 06/15/2015 11/23/14   [name], PA-C  metoprolol  tartrate (LOPRESSOR) 25 MG tablet Take 25 mg by mouth 2 (two) times daily.    Historical Provider, MD  Multiple Vitamin (MULTIVITAMIN WITH MINERALS) TABS tablet Take 1 tablet by mouth daily.    Historical Provider, MD  Multiple Vitamins-Minerals (PRESERVISION AREDS 2 PO) Take 1 tablet by mouth daily.    Historical Provider, MD  nitroGLYCERIN (NITROSTAT) 0.4 MG SL tablet Place 0.4 mg under the tongue every 5 (five) minutes as needed for chest pain.    Historical Provider, MD  predniSONE (DELTASONE) 5 MG tablet Take 5 mg by mouth daily with breakfast.    Historical Provider, MD  sulfamethoxazole-trimethoprim (BACTRIM,SEPTRA) 400-80 MG tablet Take  1 tablet by mouth daily.    Historical Provider, MD  tacrolimus (PROGRAF) 1 MG capsule Take 6-7 mg by mouth 2 (two) times daily. Take 7 mg every morning and 6 mg every night.    Historical Provider, MD   Meds Ordered and Administered this Visit  Medications - No data to display  BP 138/77 (BP Location: Left Arm)   Pulse 70   Temp 97.9 F (36.6 C) (Oral)   Resp 16   SpO2 100%  No data found.   Physical Exam  Constitutional: He is oriented to person, place, and time. He appears well-developed and well-nourished. No distress.  HENT:  Head: Normocephalic and atraumatic.  Eyes: EOM are normal.  Neck: Normal range of motion.  Cardiovascular: Normal rate.   Pulmonary/Chest: Effort normal.  Musculoskeletal: Normal range of motion. He exhibits no edema, tenderness or deformity.  Left elbow: no deformity, edema, or tenderness. Full ROM  Neurological: He is alert and oriented to person, place, and time.  Skin: Skin is warm and dry. He is not diaphoretic.  Left elbow: skin in tact, no ecchymosis or erythema. Pea-sized mobile, non-tender, nodule to lateral aspect of proximal forearm just distal to lateral epicondyle.   Psychiatric: He has a normal mood and affect. His behavior is normal.  Nursing note and vitals reviewed.   Urgent Care Course   Clinical Course    Procedures (including critical care time)  Labs Review Labs Reviewed - No data to display  Imaging Review Dg Elbow Complete Left  Result Date: 11/28/2015 CLINICAL DATA:  60 year old male with trauma to the left elbow. EXAM: LEFT ELBOW - COMPLETE 3+ VIEW COMPARISON:  None. FINDINGS: There is no acute fracture or dislocation. No significant arthritic changes. No joint effusion. The soft tissues appear unremarkable. IMPRESSION: Negative. Electronically Signed   By: Elgie CollardArash  Radparvar M.D.   On: 11/28/2015 20:55      MDM   1. Skin nodule   2. Elbow injury, left, initial encounter     Pt concerned for a "nodule" on Left  elbow after it was hit by car door 2 days ago.  Full ROM elbow, non-tender. Advised pt the nodule is likely a cyst or calcification, doubt fracture.  Pt insistent on getting imaging "just to make sure" it is not fractured.  Plain films: negative for fracture, dislocation or joint effusion.  Reassured pt of normal plain films.   F/u with PCP as needed.    Junius Finnerrin O'Malley, PA-C 11/28/15 2110

## 2017-05-21 ENCOUNTER — Emergency Department (HOSPITAL_COMMUNITY): Payer: Self-pay

## 2017-05-21 ENCOUNTER — Emergency Department (HOSPITAL_COMMUNITY)
Admission: EM | Admit: 2017-05-21 | Discharge: 2017-05-22 | Disposition: A | Discharge status: Home / Self Care | Payer: Self-pay | Attending: Emergency Medicine | Admitting: Emergency Medicine

## 2017-05-21 DIAGNOSIS — K529 Noninfective gastroenteritis and colitis, unspecified: Secondary | ICD-10-CM

## 2017-05-21 DIAGNOSIS — R7989 Other specified abnormal findings of blood chemistry: Secondary | ICD-10-CM

## 2017-05-21 LAB — CBC WITH DIFFERENTIAL/PLATELET
Basophils Absolute: 0 10*3/uL (ref 0.0–0.1)
Basophils Relative: 0 %
Eosinophils Absolute: 0.1 10*3/uL (ref 0.0–0.7)
Eosinophils Relative: 1 %
HCT: 41.3 % (ref 39.0–52.0)
Hemoglobin: 14.8 g/dL (ref 13.0–17.0)
Lymphocytes Relative: 22 %
Lymphs Abs: 1.1 10*3/uL (ref 0.7–4.0)
MCH: 35.9 pg — ABNORMAL HIGH (ref 26.0–34.0)
MCHC: 35.8 g/dL (ref 30.0–36.0)
MCV: 100.2 fL — ABNORMAL HIGH (ref 78.0–100.0)
Monocytes Absolute: 0.3 10*3/uL (ref 0.1–1.0)
Monocytes Relative: 6 %
Neutro Abs: 3.8 10*3/uL (ref 1.7–7.7)
Neutrophils Relative %: 71 %
Platelets: 134 10*3/uL — ABNORMAL LOW (ref 150–400)
RBC: 4.12 MIL/uL — ABNORMAL LOW (ref 4.22–5.81)
RDW: 12.4 % (ref 11.5–15.5)
WBC: 5.3 10*3/uL (ref 4.0–10.5)

## 2017-05-21 LAB — COMPREHENSIVE METABOLIC PANEL
ALT: 23 U/L (ref 17–63)
AST: 33 U/L (ref 15–41)
Albumin: 4.3 g/dL (ref 3.5–5.0)
Alkaline Phosphatase: 111 U/L (ref 38–126)
Anion gap: 11 (ref 5–15)
BUN: 30 mg/dL — ABNORMAL HIGH (ref 6–20)
CO2: 28 mmol/L (ref 22–32)
Calcium: 10.3 mg/dL (ref 8.9–10.3)
Chloride: 100 mmol/L — ABNORMAL LOW (ref 101–111)
Creatinine, Ser: 1.78 mg/dL — ABNORMAL HIGH (ref 0.61–1.24)
GFR calc Af Amer: 45 mL/min — ABNORMAL LOW (ref >60)
GFR calc non Af Amer: 39 mL/min — ABNORMAL LOW (ref >60)
Glucose, Bld: 161 mg/dL — ABNORMAL HIGH (ref 65–99)
Potassium: 4 mmol/L (ref 3.5–5.1)
Sodium: 139 mmol/L (ref 135–145)
Total Bilirubin: 0.7 mg/dL (ref 0.3–1.2)
Total Protein: 10.5 g/dL — ABNORMAL HIGH (ref 6.5–8.1)

## 2017-05-21 LAB — LIPASE, BLOOD: Lipase: 58 U/L — ABNORMAL HIGH (ref 11–51)

## 2017-05-21 LAB — I-STAT TROPONIN, ED: Troponin i, poc: 0 ng/mL (ref 0.00–0.08)

## 2017-05-21 MED ORDER — IOPAMIDOL (ISOVUE-300) INJECTION 61%
100.0000 mL | Freq: Once | INTRAVENOUS | Status: AC | PRN
  Administered 2017-05-21: 80 mL via INTRAVENOUS

## 2017-05-21 MED ORDER — IOPAMIDOL (ISOVUE-300) INJECTION 61%
INTRAVENOUS | Status: AC
Start: 2017-05-21 — End: 2017-05-22
  Filled 2017-05-21: qty 100

## 2017-05-21 MED ORDER — ONDANSETRON 4 MG PO TBDP: 4.0000 mg | ORAL_TABLET | Freq: Three times a day (TID) | ORAL | 0 refills | Status: DC | PRN

## 2017-05-21 MED ORDER — SODIUM CHLORIDE 0.9 % IV BOLUS (SEPSIS)
500.0000 mL | Freq: Once | INTRAVENOUS | Status: AC
  Administered 2017-05-21: 500 mL via INTRAVENOUS

## 2017-05-21 MED ORDER — SODIUM CHLORIDE 0.9 % IJ SOLN
INTRAMUSCULAR | Status: AC
  Filled 2017-05-21: qty 50

## 2017-05-21 NOTE — ED Provider Notes (Signed)
[facility]-EMERGENCY DEPT Provider Note   CSN: [id number] Arrival date & time: 05/21/17  1942     History   Chief Complaint Chief Complaint  Patient presents with  . Abdominal Pain  . Chest Pain    HPI Jacob Erickson is a 62 y.o. male.  The history is provided by the patient and medical records. No language interpreter was used.  Abdominal Pain   Associated symptoms include diarrhea and nausea. Pertinent negatives include vomiting.  Chest Pain   Associated symptoms include abdominal pain and nausea. Pertinent negatives include no palpitations and no vomiting.   Jacob CradleCharles A Jacob Erickson is a 62 y.o. 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 Patient reports pain felt like gas all built up and making his stomach swell. Pain radiates up towards chest. Associated with nausea, no vomiting. No medication taken at home for symptoms. Was given zofran en route with relief in nausea. Reports history of similar sxs in the past. He reports having a tube put down to let the gas come up and staying in the hospital for this. Sounds like history of bowel obstruction? Denies associated shortness of breath, diaphoresis, back pain, urinary symptoms, fever, chills, body aches, cough, congestion.   Past Medical History:  Diagnosis Date  . Hypertension   . MI (myocardial infarction) [facility](HCC)     Patient Active Problem List   Diagnosis Date Noted  . HTN (hypertension) 04/04/2014    Past Surgical History:  Procedure Laterality Date  . CORONARY ARTERY BYPASS GRAFT    . LUNG TRANSPLANT, DOUBLE    . NISSEN FUNDOPLICATION         Home Medications    Prior to Admission medications   Medication Sig Start Date End Date Taking? Authorizing Provider  atorvastatin (LIPITOR) 20 MG tablet Take 20 mg by mouth daily.   Yes [provider]    calcium carbonate 1250 MG capsule Take 1,250 mg by mouth 2 (two) times daily with a meal.   Yes [provider]  ferrous gluconate (FERGON) 324 MG tablet Take 324 mg by mouth 2 (two) times daily with a meal.   Yes [provider]  fluticasone (CLARISPRAY) 50 MCG/ACT nasal spray Place 2 sprays into both nostrils daily.   Yes [provider]  ibandronate (BONIVA) 150 MG tablet Take 150 mg by mouth every 30 (thirty) days. Take in the morning with a full glass of water, on an empty stomach, and do not take anything else by mouth or lie down for the next 30 min.   Yes [provider]  magnesium aspartate (MAGINEX) 615 MG tablet Take 615 mg by mouth 2 (two) times daily.   Yes [provider]  metoprolol tartrate (LOPRESSOR) 25 MG tablet Take 25 mg by mouth 2 (two) times daily.   Yes [provider]  predniSONE (DELTASONE) 5 MG tablet Take 5 mg by mouth daily with breakfast.   Yes [provider]  tacrolimus (PROGRAF) 1 MG capsule Take 5-6 mg by mouth 2 (two) times daily. 6 mg in morning and 5 mg at night   Yes [provider]  traMADol (ULTRAM) 50 MG tablet Take 50 mg by mouth every 8 (eight) hours as needed for moderate pain.   Yes [provider]  ondansetron (ZOFRAN ODT) 4 MG disintegrating tablet Take 1 tablet (4 mg  total) by mouth every 8 (eight) hours as needed for nausea or vomiting. 05/21/17   Mouhamadou Gittleman, Chase Picket, PA-C    Family History History reviewed. No pertinent family history.  Social History Social History   Tobacco Use  . Smoking status: Never Smoker  . Smokeless tobacco: Never Used  Substance Use Topics  . Alcohol use: No    Frequency: Never  . Drug use: Not on file     Allergies   Patient has no known allergies.   Review of Systems Review of Systems  Cardiovascular: Positive for chest pain. Negative for palpitations and leg swelling.  Gastrointestinal: Positive for abdominal pain, diarrhea  and nausea. Negative for vomiting.  All other systems reviewed and are negative.    Physical Exam Updated Vital Signs BP (!) 139/91 (BP Location: Right Arm)   Pulse 88   Temp 97.6 F (36.4 C) (Oral)   Resp 19   SpO2 100%   Physical Exam  Constitutional: He is oriented to person, place, and time. He appears well-developed and well-nourished. No distress.  HENT:  Head: Normocephalic and atraumatic.  Cardiovascular: Normal rate, regular rhythm and normal heart sounds.  No murmur heard. Pulmonary/Chest: Effort normal and breath sounds normal. No respiratory distress.  Abdominal: Soft. He exhibits no distension.    Musculoskeletal: He exhibits no edema.  Neurological: He is alert and oriented to person, place, and time.  Skin: Skin is warm and dry.  Nursing note and vitals reviewed.    ED Treatments / Results  Labs (all labs ordered are listed, but only abnormal results are displayed) Labs Reviewed  CBC WITH DIFFERENTIAL/PLATELET - Abnormal; Notable for the following components:      Result Value   RBC 4.12 (*)    MCV 100.2 (*)    MCH 35.9 (*)    Platelets 134 (*)    All other components within normal limits  COMPREHENSIVE METABOLIC PANEL - Abnormal; Notable for the following components:   Chloride 100 (*)    Glucose, Bld 161 (*)    BUN 30 (*)    Creatinine, Ser 1.78 (*)    Total Protein 10.5 (*)    GFR calc non Af Amer 39 (*)    GFR calc Af Amer 45 (*)    All other components within normal limits  LIPASE, BLOOD - Abnormal; Notable for the following components:   Lipase 58 (*)    All other components within normal limits  GASTROINTESTINAL PANEL BY PCR, STOOL (REPLACES STOOL CULTURE)  URINALYSIS, ROUTINE W REFLEX MICROSCOPIC  I-STAT TROPONIN, ED    EKG  EKG Interpretation  Date/Time:  Friday May 21 2017 22:03:03 EST Ventricular Rate:  79 PR Interval:    QRS Duration: 118 QT Interval:  377 QTC Calculation: 433 R Axis:   -62 Text Interpretation:  Sinus  rhythm Probable left atrial enlargement Left anterior fascicular block Left ventricular hypertrophy Confirmed by Loren Racer ([id number]) on 05/21/2017 10:22:15 PM       Radiology Ct Abdomen Pelvis W Contrast  Result Date: 05/21/2017 CLINICAL DATA:  Abdominal pain EXAM: CT ABDOMEN AND PELVIS WITH CONTRAST TECHNIQUE: Multidetector CT imaging of the abdomen and pelvis was performed using the standard protocol following bolus administration of intravenous contrast. CONTRAST:  80mL ISOVUE-300 IOPAMIDOL (ISOVUE-300) INJECTION 61% COMPARISON:  None. FINDINGS: Lower chest: Multifocal subsegmental atelectasis. Hepatobiliary: Normal hepatic contours and density. No visible biliary dilatation. Normal gallbladder. Pancreas: Normal parenchymal contours without ductal dilatation. No peripancreatic fluid collection. Spleen: Normal. Adrenals/Urinary Tract: --Adrenal glands:  Normal. --Right kidney/ureter: No hydronephrosis, perinephric stranding or nephrolithiasis. No obstructing ureteral stones. --Left kidney/ureter: No hydronephrosis, perinephric stranding or nephrolithiasis. No obstructing ureteral stones. --Urinary bladder: Normal appearance for the degree of distention. Stomach/Bowel: --Stomach/Duodenum: No hiatal hernia or other gastric abnormality. Normal duodenal course. --Small bowel: No dilatation or inflammation. --Colon: Fluid-filled colon without focal abnormality. --Appendix: Not visualized. No right lower quadrant inflammation or free fluid. Vascular/Lymphatic: Normal course and caliber of the major abdominal vessels. No abdominal or pelvic lymphadenopathy. Reproductive: Mildly enlarged prostate measuring 5.8 cm. Musculoskeletal. Multilevel degenerative disc disease and facet arthrosis. No bony spinal canal stenosis. Prominent L4 superior endplate Schmorl's node. Other: None. IMPRESSION: Fluid-filled colon and small bowel, compatible with an acute diarrheal illness. No inflammation or dilatation of the small  bowel or colon. Electronically Signed   By: Jacob Erickson M.D.   On: 05/21/2017 23:39    Procedures Procedures (including critical care time)  Medications Ordered in ED Medications  iopamidol (ISOVUE-300) 61 % injection (not administered)  sodium chloride 0.9 % injection (not administered)  sodium chloride 0.9 % bolus 500 mL (500 mLs Intravenous New Bag/Given 05/21/17 2239)  iopamidol (ISOVUE-300) 61 % injection 100 mL (80 mLs Intravenous Contrast Given 05/21/17 2314)     Initial Impression / Assessment and Plan / ED Course  I have reviewed the triage vital signs and the nursing notes.  Pertinent labs & imaging results that were available during my care of the patient were reviewed by me and considered in my medical decision making (see chart for details).    TAYQUAN GASSMAN is a 62 y.o. male who presents to ED for abdominal pain, nausea and diarrhea which began today. On exam, patient is afebrile, hemodynamically stable with tenderness to upper and left-sided abdomen. Patient does have cardiac hx and reports upper abdominal pain radiates toward chest. EKG reassuring. Troponin negative. History and exam now c/w ACS. Labs reviewed. Patient does have elevated BUN/Cr. He reports history of kidney disease but is unsure of his baseline. Gentle IV hydration in ED provided. Has PCP follow up for lab recheck already scheduled. White count normal. CT c/w acute diarrheal illness. On repeat examination, patient reports having 4-5 bowel movements in the interim. Repeat abdominal exam benign - no tenderness. He feels improved after having BM's in ED. Will obtain GI PCR. UA also pending at shift change. Care assumed by oncoming provider PA [facility]. Will follow up on UA. If negative, likely discharge home with PCP follow up. Reasons to return to ER and follow up care already discussed with patient and family at bedside.    Final Clinical Impressions(s) / ED Diagnoses   Final diagnoses:  Enteritis    Elevated serum creatinine    ED Discharge Orders        Ordered    ondansetron (ZOFRAN ODT) 4 MG disintegrating tablet  Every 8 hours PRN     05/21/17 2358       Jacee Enerson, Chase Picket, PA-C 05/22/17 0016    Loren Racer, MD 05/22/17 [phone]

## 2017-05-22 LAB — URINALYSIS, ROUTINE W REFLEX MICROSCOPIC
Bilirubin Urine: NEGATIVE
Glucose, UA: NEGATIVE mg/dL
Hgb urine dipstick: NEGATIVE
Ketones, ur: NEGATIVE mg/dL
Leukocytes, UA: NEGATIVE
Nitrite: NEGATIVE
Protein, ur: NEGATIVE mg/dL
Specific Gravity, Urine: >1.046 — ABNORMAL HIGH (ref 1.005–1.030)
pH: 5 (ref 5.0–8.0)

## 2017-05-22 LAB — GASTROINTESTINAL PANEL BY PCR, STOOL (REPLACES STOOL CULTURE)

## 2017-05-23 ENCOUNTER — Emergency Department (HOSPITAL_COMMUNITY)
Admission: EM | Admit: 2017-05-23 | Discharge: 2017-05-23 | Discharge status: Absent without leave | Payer: Self-pay | Source: Home / Self Care

## 2018-09-28 ENCOUNTER — Telehealth: Payer: Self-pay | Admitting: Internal Medicine

## 2018-09-28 NOTE — Telephone Encounter (Signed)
DOD September 28, 2018   Dr. Henrene Pastor, pt prefers MD in [place].  There is a referral for pt to have a colonoscopy--[place] ID [id number].  Previous colonoscopy in 2014 at [facility] can be viewed in [place].  Records will also be sent to you for review.  Please advise scheduling.

## 2018-10-11 ENCOUNTER — Telehealth: Payer: Self-pay | Admitting: Internal Medicine

## 2018-10-11 NOTE — Telephone Encounter (Signed)
Outside records from [place] reviewed.  Patient has had more than 1 colonoscopy.  Appears 2002 and again in 2014 (January 17, 2013).  At that time found to have a diminutive a sending colon polyp.  No corresponding pathology.  Follow-up in 5 years recommended. Based on the available information, the patient would likely be an appropriate candidate for surveillance colonoscopy in the [place].  However, it appears that he is on Plavix.  Thus, please schedule a routine in person office visit with one of our advanced practitioners.  Thanks

## 2018-10-11 NOTE — Telephone Encounter (Signed)
Please check status of records sent for review September 28, 2018.

## 2018-10-11 NOTE — Telephone Encounter (Signed)
Records on Dr. Blanch Media desk for review.  He is in the office today so hopefully he will look at them

## 2018-10-12 ENCOUNTER — Encounter: Payer: Self-pay | Admitting: Internal Medicine

## 2018-10-13 NOTE — Telephone Encounter (Signed)
Sure. He will see previsit nurse for thorough evaluation pre-procedure

## 2018-10-13 NOTE — Telephone Encounter (Signed)
Pt informed that he is no longer on Plavix, so I scheduled from for direct [place].

## 2018-10-13 NOTE — Telephone Encounter (Signed)
Just wanted to double check that it was ok to have scheduled him for a direct colon after Verdis Prime clarified with him that he was no longer taking Plavix

## 2018-10-18 ENCOUNTER — Encounter: Payer: Medicare Other | Admitting: Internal Medicine

## 2018-10-25 ENCOUNTER — Encounter: Payer: Medicare Other | Admitting: Internal Medicine

## 2018-11-10 ENCOUNTER — Ambulatory Visit: Payer: Medicare Other | Admitting: Gastroenterology

## 2019-06-08 ENCOUNTER — Emergency Department (HOSPITAL_COMMUNITY)
Admission: EM | Admit: 2019-06-08 | Discharge: 2019-06-08 | Disposition: A | Discharge status: Home / Self Care | Payer: Medicare Other | Attending: Emergency Medicine | Admitting: Emergency Medicine

## 2019-06-08 ENCOUNTER — Emergency Department (HOSPITAL_COMMUNITY): Payer: Medicare Other

## 2019-06-08 DIAGNOSIS — Z79899 Other long term (current) drug therapy: Secondary | ICD-10-CM | POA: Diagnosis not present

## 2019-06-08 DIAGNOSIS — I2510 Atherosclerotic heart disease of native coronary artery without angina pectoris: Secondary | ICD-10-CM | POA: Diagnosis not present

## 2019-06-08 DIAGNOSIS — Z7982 Long term (current) use of aspirin: Secondary | ICD-10-CM | POA: Insufficient documentation

## 2019-06-08 DIAGNOSIS — R110 Nausea: Secondary | ICD-10-CM | POA: Insufficient documentation

## 2019-06-08 LAB — BASIC METABOLIC PANEL
Anion gap: 11 (ref 5–15)
BUN: 63 mg/dL — ABNORMAL HIGH (ref 8–23)
CO2: 23 mmol/L (ref 22–32)
Calcium: 8.5 mg/dL — ABNORMAL LOW (ref 8.9–10.3)
Chloride: 106 mmol/L (ref 98–111)
Creatinine, Ser: 1.61 mg/dL — ABNORMAL HIGH (ref 0.61–1.24)
GFR calc Af Amer: 51 mL/min — ABNORMAL LOW (ref >60)
GFR calc non Af Amer: 44 mL/min — ABNORMAL LOW (ref >60)
Glucose, Bld: 96 mg/dL (ref 70–99)
Potassium: 4.2 mmol/L (ref 3.5–5.1)
Sodium: 140 mmol/L (ref 135–145)

## 2019-06-08 LAB — CBC WITH DIFFERENTIAL/PLATELET
Abs Immature Granulocytes: 0.02 10*3/uL (ref 0.00–0.07)
Basophils Absolute: 0 10*3/uL (ref 0.0–0.1)
Basophils Relative: 1 %
Eosinophils Absolute: 0.1 10*3/uL (ref 0.0–0.5)
Eosinophils Relative: 1 %
HCT: 24.9 % — ABNORMAL LOW (ref 39.0–52.0)
Hemoglobin: 8.2 g/dL — ABNORMAL LOW (ref 13.0–17.0)
Immature Granulocytes: 1 %
Lymphocytes Relative: 29 %
Lymphs Abs: 1.2 10*3/uL (ref 0.7–4.0)
MCH: 35.3 pg — ABNORMAL HIGH (ref 26.0–34.0)
MCHC: 32.9 g/dL (ref 30.0–36.0)
MCV: 107.3 fL — ABNORMAL HIGH (ref 80.0–100.0)
Monocytes Absolute: 0.7 10*3/uL (ref 0.1–1.0)
Monocytes Relative: 17 %
Neutro Abs: 2.2 10*3/uL (ref 1.7–7.7)
Neutrophils Relative %: 51 %
Platelets: 101 10*3/uL — ABNORMAL LOW (ref 150–400)
RBC: 2.32 MIL/uL — ABNORMAL LOW (ref 4.22–5.81)
RDW: 11.9 % (ref 11.5–15.5)
WBC: 4.3 10*3/uL (ref 4.0–10.5)
nRBC: 0 % (ref 0.0–0.2)

## 2019-06-08 LAB — TROPONIN I (HIGH SENSITIVITY): Troponin I (High Sensitivity): 2 ng/L (ref <18)

## 2019-06-08 MED ORDER — LIDOCAINE VISCOUS HCL 2 % MT SOLN: 15.0000 mL | OROMUCOSAL | 0 refills | Status: DC | PRN

## 2019-06-08 MED ORDER — METOCLOPRAMIDE HCL 5 MG/ML IJ SOLN
10.0000 mg | Freq: Once | INTRAMUSCULAR | Status: AC
  Administered 2019-06-08: 10 mg via INTRAVENOUS
  Filled 2019-06-08: qty 2

## 2019-06-08 MED ORDER — ALUM & MAG HYDROXIDE-SIMETH 200-200-20 MG/5ML PO SUSP
30.0000 mL | Freq: Once | ORAL | Status: AC
  Administered 2019-06-08: 30 mL via ORAL
  Filled 2019-06-08: qty 30

## 2019-06-08 MED ORDER — HYOSCYAMINE SULFATE 0.125 MG SL SUBL
0.1250 mg | SUBLINGUAL_TABLET | Freq: Once | SUBLINGUAL | Status: AC
  Administered 2019-06-08: 0.125 mg via SUBLINGUAL
  Filled 2019-06-08: qty 1

## 2019-06-08 MED ORDER — SODIUM CHLORIDE 0.9 % IV BOLUS
500.0000 mL | Freq: Once | INTRAVENOUS | Status: AC
  Administered 2019-06-08: 500 mL via INTRAVENOUS

## 2019-06-08 MED ORDER — HYOSCYAMINE SULFATE 0.125 MG PO TABS
0.1250 mg | ORAL_TABLET | Freq: Once | ORAL | Status: DC
  Filled 2019-06-08: qty 1

## 2019-06-08 MED ORDER — LIDOCAINE VISCOUS HCL 2 % MT SOLN
15.0000 mL | Freq: Once | OROMUCOSAL | Status: AC
  Administered 2019-06-08: 15 mL via ORAL
  Filled 2019-06-08: qty 15

## 2019-06-08 NOTE — ED Provider Notes (Signed)
Attestation: Medical screening examination/treatment/procedure(s) were conducted as a shared visit with non-physician practitioner(s) and myself.  I personally evaluated the patient during the encounter.   Briefly, the patient is a 64 y.o. male with h/o nisen sx, here for nausea, no emesis.   Vitals:   06/08/19 0430 06/08/19 0445  BP: (!) 116/55 (!) 110/57  Pulse: 83 79  Resp: (!) 22 (!) 24  Temp:    SpO2: 100% 100%    CONSTITUTIONAL:  well-appearing, NAD NEURO:  Alert and oriented x 3, no focal deficits EYES:  pupils equal and reactive ENT/NECK:  trachea midline, no JVD CARDIO:  Reg  rate, reg rhythm, well-perfused PULM:  None labored breathing GI/GU:  Abdomin non-distended MSK/SPINE:  No gross deformities, no edema SKIN:  no rash, atraumatic PSYCH:  Appropriate speech and behavior   EKG Interpretation  Date/Time:  Thursday June 08 2019 02:33:48 EDT Ventricular Rate:  78 PR Interval:    QRS Duration: 114 QT Interval:  387 QTC Calculation: 441 R Axis:   -50 Text Interpretation: Sinus rhythm Probable left atrial enlargement LAD, consider left anterior fascicular block Abnormal R-wave progression, early transition No significant change since last tracing Confirmed by Drema Pry [phone]) on 06/08/2019 2:43:34 AM       Work up with reassuring labs. Antiemetics did not provide relief. Given GI cocktail which resolved his nausea.      Nira Conn, MD 06/08/19 [phone]

## 2019-06-08 NOTE — ED Triage Notes (Signed)
Pt arrives via [facility] from home   Pt endorses unbearable nausea.  Pt has hx of Nissen Fundoplication, unable to burp or vomit.   Pt a&ox4, given Zofran with EMS with no relief.

## 2019-06-08 NOTE — Discharge Instructions (Signed)
Can mix the lidocaine with maalox to make GI cocktail like we gave you here.  Bland diet for now, progress back to normal as tolerated. Follow-up with your primary are doctor. Return to the ED for new or worsening symptoms.

## 2019-06-08 NOTE — ED Provider Notes (Signed)
[facility] EMERGENCY DEPARTMENT Provider Note   CSN: [id number] Arrival date & time: 06/08/19  0147     History Chief Complaint  Patient presents with  . Nausea    Jacob Erickson is a 64 y.o. male.  The history is provided by the patient and medical records.    64 y.o. 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  Patient reports this began today around 5PM.  States he feels incredibly nauseated, has had some mouth watering episodes along with lightheadedness.  States that the worst he was salivating so bad it almost seemed like he was drooling, he went to go spit but got lightheaded and sweaty when he stood up and almost feel like he was going to pass out.  States this caused him to panic and had a brief episode of chest tightness but denies any chest pain currently.  States due to his Nissen, he is unable to belch or vomit but states he thinks he would feel better if he could throw up.  He states he ate fine throughout the day today, none of his food tasted abnormal.  Wife ate the same thing and she is not currently having any other symptoms.  He did have some dental work done on Tuesday, had 4 teeth pulled and was started on amoxicillin which she thinks may have caused his symptoms.  States he did have a brief episode of diarrhea on Monday which is not necessarily uncommon for him, that has since resolved.  He has not had any noted fevers.  No cough, sore throat, or other upper respiratory symptoms.  States he has been doing well from his double lung transplant, 19 years post transplant.  He had recent follow-up earlier this month and exams were normal.  Past Medical History:  Diagnosis Date  . Coronary artery disease   . High cholesterol     There are no problems to display for this patient.   Past Surgical History:  Procedure Laterality Date  . CORONARY ANGIOPLASTY  WITH STENT PLACEMENT    . LUNG TRANSPLANT, DOUBLE    . NISSEN FUNDOPLICATION         No family history on file.  Social History   Tobacco Use  . Smoking status: Never Smoker  . Smokeless tobacco: Never Used  Substance Use Topics  . Alcohol use: No  . Drug use: Not on file    Home Medications Prior to Admission medications   Medication Sig Start Date End Date Taking? Authorizing Provider  aspirin 81 MG chewable tablet Chew 81 mg by mouth daily.    [provider]  atorvastatin (LIPITOR) 20 MG tablet Take 20 mg by mouth daily.    [provider]  calcium-vitamin D (OSCAL WITH D) 250-125 MG-UNIT per tablet Take 2 tablets by mouth 2 (two) times daily.    [provider]  clopidogrel (PLAVIX) 75 MG tablet Take 75 mg by mouth daily.    [provider]  cyclobenzaprine (FLEXERIL) 5 MG tablet Take 5 mg by mouth 3 (three) times daily as needed for muscle spasms.    [provider]  ezetimibe (ZETIA) 10 MG tablet Take 10 mg by mouth daily.    [provider]  ferrous sulfate 325 (65 FE) MG tablet Take 325 mg by mouth 2 (two) times daily with a meal.    [provider]  guaiFENesin (MUCINEX)  600 MG 12 hr tablet Take 600 mg by mouth 2 (two) times daily as needed for cough or to loosen phlegm.    [provider]  HYDROcodone-acetaminophen (NORCO/VICODIN) 5-325 MG per tablet Take 1 tablet by mouth every 6 (six) hours as needed for moderate pain or severe pain. Patient not taking: Reported on 10/26/2014 04/19/14   Antony Madura, PA-C  ibandronate (BONIVA) 150 MG tablet Take 150 mg by mouth every 30 (thirty) days. Take in the morning with a full glass of water, on an empty stomach, and do not take anything else by mouth or lie down for the next 30 min.    [provider]  latanoprost (XALATAN) 0.005 % ophthalmic solution Place 2 drops into both eyes at bedtime.    [provider]  levofloxacin (LEVAQUIN) 750 MG  tablet Take 1 tablet (750 mg total) by mouth daily. Patient not taking: Reported on 06/15/2015 11/23/14   [name], PA-C  metoprolol tartrate (LOPRESSOR) 25 MG tablet Take 25 mg by mouth 2 (two) times daily.    [provider]  Multiple Vitamin (MULTIVITAMIN WITH MINERALS) TABS tablet Take 1 tablet by mouth daily.    [provider]  Multiple Vitamins-Minerals (PRESERVISION AREDS 2 PO) Take 1 tablet by mouth daily.    [provider]  nitroGLYCERIN (NITROSTAT) 0.4 MG SL tablet Place 0.4 mg under the tongue every 5 (five) minutes as needed for chest pain.    [provider]  predniSONE (DELTASONE) 5 MG tablet Take 5 mg by mouth daily with breakfast.    [provider]  sulfamethoxazole-trimethoprim (BACTRIM,SEPTRA) 400-80 MG tablet Take 1 tablet by mouth daily.    [provider]  tacrolimus (PROGRAF) 1 MG capsule Take 6-7 mg by mouth 2 (two) times daily. Take 7 mg every morning and 6 mg every night.    [provider]    Allergies    Patient has no known allergies.  Review of Systems   Review of Systems  Gastrointestinal: Positive for nausea.  All other systems reviewed and are negative.   Physical Exam Updated Vital Signs BP 117/63 (BP Location: Left Arm)   Pulse 76   Temp 99.3 F (37.4 C) (Oral)   Resp 14   SpO2 100%   Physical Exam Vitals and nursing note reviewed.  Constitutional:      Appearance: He is well-developed.     Comments: Looks nauseated  HENT:     Head: Normocephalic and atraumatic.  Eyes:     Conjunctiva/sclera: Conjunctivae normal.     Pupils: Pupils are equal, round, and reactive to light.  Cardiovascular:     Rate and Rhythm: Normal rate and regular rhythm.     Heart sounds: Normal heart sounds.  Pulmonary:     Effort: Pulmonary effort is normal. No respiratory distress.     Breath sounds: Normal breath sounds. No rhonchi.  Abdominal:     General: Bowel sounds are normal.     Palpations:  Abdomen is soft.     Tenderness: There is no abdominal tenderness. There is no rebound.     Comments: Abdomen very soft, non-tender  Musculoskeletal:        General: Normal range of motion.     Cervical back: Normal range of motion.  Skin:    General: Skin is warm and dry.  Neurological:     Mental Status: He is alert and oriented to person, place, and time.     ED Results / Procedures /  Treatments   Labs (all labs ordered are listed, but only abnormal results are displayed) Labs Reviewed  CBC WITH DIFFERENTIAL/PLATELET - Abnormal; Notable for the following components:      Result Value   RBC 2.32 (*)    Hemoglobin 8.2 (*)    HCT 24.9 (*)    MCV 107.3 (*)    MCH 35.3 (*)    Platelets 101 (*)    All other components within normal limits  BASIC METABOLIC PANEL - Abnormal; Notable for the following components:   BUN 63 (*)    Creatinine, Ser 1.61 (*)    Calcium 8.5 (*)    GFR calc non Af Amer 44 (*)    GFR calc Af Amer 51 (*)    All other components within normal limits  TROPONIN I (HIGH SENSITIVITY)    EKG EKG Interpretation  Date/Time:  Thursday June 08 2019 02:33:48 EDT Ventricular Rate:  78 PR Interval:    QRS Duration: 114 QT Interval:  387 QTC Calculation: 441 R Axis:   -50 Text Interpretation: Sinus rhythm Probable left atrial enlargement LAD, consider left anterior fascicular block Abnormal R-wave progression, early transition No significant change since last tracing Confirmed by Addison Lank [phone]) on 06/08/2019 2:43:34 AM   Radiology DG Chest Port 1 View  Result Date: 06/08/2019 CLINICAL DATA:  64 year old male with chest tightness. History of lung transplant. EXAM: PORTABLE CHEST 1 VIEW COMPARISON:  Chest radiograph dated 06/15/2015. FINDINGS: Stable appearance of the lungs and postsurgical changes. No new consolidation. No pneumothorax. Stable cardiomediastinal silhouette. No acute osseous pathology. IMPRESSION: No acute cardiopulmonary process.  No  interval change. Electronically Signed   By: Anner Crete M.D.   On: 06/08/2019 02:43    Procedures Procedures (including critical care time)  Medications Ordered in ED Medications - No data to display  ED Course  I have reviewed the triage vital signs and the nursing notes.  Pertinent labs & imaging results that were available during my care of the patient were reviewed by me and considered in my medical decision making (see chart for details).    MDM Rules/Calculators/A&P  64 year old male here with persistent nausea since 5 PM this evening.  Denies any abdominal pain.  He has history of Nissen fundoplication and therefore is unable to vomit.  He denies any diarrhea.  He did have dental work done earlier this week and was started on amoxicillin, thinks this is likely contributing to his symptoms.  He did not have any relief with Zofran from EMS.  He is afebrile and nontoxic in appearance here.  He does appear queasy but abdomen is overall soft and benign.  EKG is nonischemic.  Labs are reassuring.  Chest x-ray is clear.  Initially ordered small IV fluid bolus along with Reglan and patient did not have great relief.  He was requesting NG tube as he reports this has been done for him in the past, however discussed with family that this is not common practice for nausea without any other obstructive process.  He was then given GI cocktail and dose of Levsin with good relief.  States he is feeling better.  Continues to deny any abdominal pain.  Feel he is stable for discharge home.  Will send with prescription for viscous lidocaine, can mix with over-the-counter Maalox to make GI cocktail.  Close follow-up with PCP.  Return here for any new/acute changes.  Shared visit with attending physician, Dr. Leonette Monarch, who evaluated patient and agrees with assessment and plan  of care.  Final Clinical Impression(s) / ED Diagnoses Final diagnoses:  Nausea    Rx / DC Orders ED Discharge Orders          Ordered    lidocaine (XYLOCAINE) 2 % solution  Every 4 hours PRN     06/08/19 0543           Garlon Hatchet, PA-C 06/08/19 0601    Nira Conn, MD 06/09/19 [phone]

## 2019-08-02 ENCOUNTER — Encounter: Payer: Self-pay | Admitting: Internal Medicine

## 2019-12-11 ENCOUNTER — Telehealth (HOSPITAL_COMMUNITY): Payer: Self-pay

## 2019-12-11 NOTE — Telephone Encounter (Signed)
Referral recv'ed for PR, referral was signed by NP. Referral has to be signed by MD. Arneta Cliche a request to [facility]. Pass to RN for review.

## 2019-12-11 NOTE — Telephone Encounter (Signed)
Outside/paper referral received by Dr. Randa Evens from [facility]. Will fax over Physician order and request further documents. Insurance benefits and eligibility to be determined.

## 2019-12-12 ENCOUNTER — Encounter (HOSPITAL_COMMUNITY): Payer: Self-pay

## 2019-12-12 ENCOUNTER — Encounter (HOSPITAL_COMMUNITY): Payer: Self-pay | Admitting: *Deleted

## 2019-12-12 NOTE — Progress Notes (Signed)
Received referral notification from Ronney Lion NP with Dr. Martha Clan for this pt to participate in Pulmonary Rehab with the diagnosis of Pneumonia due to Covid-19.  Pt completed cardiac rehab phase II in 2016 at [facility].  Pt was hospitalized at [facility] from 8/11 -8/17. Clinical review of pt follow up appt on 8/30 Primary office  as well as follow up appt at [facility] in the transplant clinic.  Pt with Covid Risk Score - 3. Pt appropriate for scheduling for Pulmonary rehab once referral form signed and returned.  Will forward to support staff for scheduling and verification of insurance eligibility/benefits with pt consent. Alanson Aly, BSN Cardiac and Emergency planning/management officer

## 2020-01-10 ENCOUNTER — Telehealth (HOSPITAL_COMMUNITY): Payer: Self-pay

## 2020-01-10 NOTE — Telephone Encounter (Signed)
pt called and statd that his doc stated that he didnt need pulmonary rehab anymore. Canceled all pt pulmonary rehab appts.

## 2020-02-08 ENCOUNTER — Telehealth (HOSPITAL_COMMUNITY): Payer: Self-pay | Admitting: *Deleted

## 2020-02-12 ENCOUNTER — Telehealth (HOSPITAL_COMMUNITY): Payer: Self-pay | Admitting: Internal Medicine

## 2020-02-12 ENCOUNTER — Ambulatory Visit (HOSPITAL_COMMUNITY): Payer: Medicare Other

## 2020-02-20 ENCOUNTER — Ambulatory Visit (HOSPITAL_COMMUNITY): Payer: Medicare Other

## 2020-02-22 ENCOUNTER — Ambulatory Visit (HOSPITAL_COMMUNITY): Payer: Medicare Other

## 2020-02-27 ENCOUNTER — Ambulatory Visit (HOSPITAL_COMMUNITY): Payer: Medicare Other

## 2020-02-29 ENCOUNTER — Ambulatory Visit (HOSPITAL_COMMUNITY): Payer: Medicare Other

## 2020-03-05 ENCOUNTER — Ambulatory Visit (HOSPITAL_COMMUNITY): Payer: Medicare Other

## 2020-03-07 ENCOUNTER — Ambulatory Visit (HOSPITAL_COMMUNITY): Payer: Medicare Other

## 2020-03-12 ENCOUNTER — Ambulatory Visit (HOSPITAL_COMMUNITY): Payer: Medicare Other

## 2020-03-14 ENCOUNTER — Ambulatory Visit (HOSPITAL_COMMUNITY): Payer: Medicare Other

## 2020-03-19 ENCOUNTER — Ambulatory Visit (HOSPITAL_COMMUNITY): Payer: Medicare Other

## 2020-03-21 ENCOUNTER — Ambulatory Visit (HOSPITAL_COMMUNITY): Payer: Medicare Other

## 2020-03-26 ENCOUNTER — Ambulatory Visit (HOSPITAL_COMMUNITY): Payer: Medicare Other

## 2020-03-28 ENCOUNTER — Ambulatory Visit (HOSPITAL_COMMUNITY): Payer: Medicare Other

## 2020-04-02 ENCOUNTER — Ambulatory Visit (HOSPITAL_COMMUNITY): Payer: Medicare Other

## 2020-04-04 ENCOUNTER — Ambulatory Visit (HOSPITAL_COMMUNITY): Payer: Medicare Other

## 2020-04-09 ENCOUNTER — Ambulatory Visit (HOSPITAL_COMMUNITY): Payer: Medicare Other

## 2020-04-11 ENCOUNTER — Ambulatory Visit (HOSPITAL_COMMUNITY): Payer: Medicare Other

## 2020-04-16 ENCOUNTER — Ambulatory Visit (HOSPITAL_COMMUNITY): Payer: Medicare Other

## 2020-04-18 ENCOUNTER — Ambulatory Visit (HOSPITAL_COMMUNITY): Payer: Medicare Other

## 2020-08-07 ENCOUNTER — Other Ambulatory Visit (HOSPITAL_COMMUNITY): Payer: Self-pay | Admitting: Adult Health

## 2020-08-07 ENCOUNTER — Other Ambulatory Visit: Payer: Self-pay

## 2020-08-07 ENCOUNTER — Ambulatory Visit (HOSPITAL_COMMUNITY)
Admission: RE | Admit: 2020-08-07 | Discharge: 2020-08-07 | Disposition: A | Discharge status: Home / Self Care | Payer: Medicare Other | Source: Ambulatory Visit | Attending: Adult Health | Admitting: Adult Health

## 2020-08-07 DIAGNOSIS — M79661 Pain in right lower leg: Secondary | ICD-10-CM

## 2020-08-07 DIAGNOSIS — M7989 Other specified soft tissue disorders: Secondary | ICD-10-CM | POA: Diagnosis not present

## 2021-01-14 ENCOUNTER — Encounter (HOSPITAL_BASED_OUTPATIENT_CLINIC_OR_DEPARTMENT_OTHER): Payer: Self-pay

## 2021-01-14 ENCOUNTER — Other Ambulatory Visit: Payer: Self-pay

## 2021-01-14 ENCOUNTER — Emergency Department (HOSPITAL_BASED_OUTPATIENT_CLINIC_OR_DEPARTMENT_OTHER): Payer: Medicare Other

## 2021-01-14 ENCOUNTER — Emergency Department (HOSPITAL_BASED_OUTPATIENT_CLINIC_OR_DEPARTMENT_OTHER)
Admission: EM | Admit: 2021-01-14 | Discharge: 2021-01-14 | Disposition: A | Discharge status: Home / Self Care | Payer: Medicare Other | Attending: Emergency Medicine | Admitting: Emergency Medicine

## 2021-01-14 DIAGNOSIS — Z955 Presence of coronary angioplasty implant and graft: Secondary | ICD-10-CM | POA: Diagnosis not present

## 2021-01-14 DIAGNOSIS — M542 Cervicalgia: Secondary | ICD-10-CM | POA: Insufficient documentation

## 2021-01-14 DIAGNOSIS — M79621 Pain in right upper arm: Secondary | ICD-10-CM | POA: Insufficient documentation

## 2021-01-14 DIAGNOSIS — Z7982 Long term (current) use of aspirin: Secondary | ICD-10-CM | POA: Insufficient documentation

## 2021-01-14 DIAGNOSIS — R519 Headache, unspecified: Secondary | ICD-10-CM | POA: Insufficient documentation

## 2021-01-14 DIAGNOSIS — M79601 Pain in right arm: Secondary | ICD-10-CM

## 2021-01-14 DIAGNOSIS — H53149 Visual discomfort, unspecified: Secondary | ICD-10-CM | POA: Diagnosis not present

## 2021-01-14 DIAGNOSIS — I2510 Atherosclerotic heart disease of native coronary artery without angina pectoris: Secondary | ICD-10-CM | POA: Diagnosis not present

## 2021-01-14 LAB — DIFFERENTIAL
Abs Immature Granulocytes: 0.01 10*3/uL (ref 0.00–0.07)
Basophils Absolute: 0 10*3/uL (ref 0.0–0.1)
Basophils Relative: 0 %
Eosinophils Absolute: 0.1 10*3/uL (ref 0.0–0.5)
Eosinophils Relative: 3 %
Immature Granulocytes: 0 %
Lymphocytes Relative: 34 %
Lymphs Abs: 1 10*3/uL (ref 0.7–4.0)
Monocytes Absolute: 0.4 10*3/uL (ref 0.1–1.0)
Monocytes Relative: 13 %
Neutro Abs: 1.4 10*3/uL — ABNORMAL LOW (ref 1.7–7.7)
Neutrophils Relative %: 50 %

## 2021-01-14 LAB — APTT: aPTT: 37 seconds — ABNORMAL HIGH (ref 24–36)

## 2021-01-14 LAB — COMPREHENSIVE METABOLIC PANEL
ALT: 18 U/L (ref 0–44)
AST: 27 U/L (ref 15–41)
Albumin: 4.1 g/dL (ref 3.5–5.0)
Alkaline Phosphatase: 66 U/L (ref 38–126)
Anion gap: 7 (ref 5–15)
BUN: 20 mg/dL (ref 8–23)
CO2: 26 mmol/L (ref 22–32)
Calcium: 9.5 mg/dL (ref 8.9–10.3)
Chloride: 103 mmol/L (ref 98–111)
Creatinine, Ser: 1.23 mg/dL (ref 0.61–1.24)
GFR, Estimated: >60 mL/min (ref >60)
Glucose, Bld: 148 mg/dL — ABNORMAL HIGH (ref 70–99)
Potassium: 5.1 mmol/L (ref 3.5–5.1)
Sodium: 136 mmol/L (ref 135–145)
Total Bilirubin: 0.6 mg/dL (ref 0.3–1.2)
Total Protein: 7.7 g/dL (ref 6.5–8.1)

## 2021-01-14 LAB — CBC
HCT: 32.6 % — ABNORMAL LOW (ref 39.0–52.0)
Hemoglobin: 11.1 g/dL — ABNORMAL LOW (ref 13.0–17.0)
MCH: 34.8 pg — ABNORMAL HIGH (ref 26.0–34.0)
MCHC: 34 g/dL (ref 30.0–36.0)
MCV: 102.2 fL — ABNORMAL HIGH (ref 80.0–100.0)
Platelets: 126 10*3/uL — ABNORMAL LOW (ref 150–400)
RBC: 3.19 MIL/uL — ABNORMAL LOW (ref 4.22–5.81)
RDW: 12.3 % (ref 11.5–15.5)
WBC: 2.8 10*3/uL — ABNORMAL LOW (ref 4.0–10.5)
nRBC: 0 % (ref 0.0–0.2)

## 2021-01-14 LAB — PROTIME-INR
INR: 1 (ref 0.8–1.2)
Prothrombin Time: 13.6 seconds (ref 11.4–15.2)

## 2021-01-14 MED ORDER — PROCHLORPERAZINE EDISYLATE 10 MG/2ML IJ SOLN
10.0000 mg | Freq: Once | INTRAMUSCULAR | Status: AC
  Administered 2021-01-14: 10 mg via INTRAVENOUS
  Filled 2021-01-14: qty 2

## 2021-01-14 MED ORDER — SODIUM CHLORIDE 0.9 % IV BOLUS
1000.0000 mL | Freq: Once | INTRAVENOUS | Status: AC
  Administered 2021-01-14: 1000 mL via INTRAVENOUS

## 2021-01-14 MED ORDER — DIPHENHYDRAMINE HCL 50 MG/ML IJ SOLN
50.0000 mg | Freq: Once | INTRAMUSCULAR | Status: AC
  Administered 2021-01-14: 50 mg via INTRAVENOUS
  Filled 2021-01-14: qty 1

## 2021-01-14 NOTE — ED Triage Notes (Addendum)
Frontal headache 7/10 with right-sided shoulder soreness radiating to right upper arm pain, onset at 0100. A/o x 4.  HA new for pt, "worst."

## 2021-01-14 NOTE — Discharge Instructions (Signed)
Your work-up today was overall reassuring.  The CT head did not show any concerning findings and your labs were also reassuring as we discussed.  We suspect your symptoms with the arm soreness and muscle soreness were related to the pumpkin carving and that may have triggered this headache.  After the medications, your headache has resolved and we feel you are now safe for discharge home.  Please follow-up with your primary doctor and if any symptoms change or worsen, please return to the nearest emergency department.

## 2021-01-14 NOTE — ED Provider Notes (Signed)
[facility] EMERGENCY DEPT Provider Note   CSN: [id number] Arrival date & time: 01/14/21  1342     History Chief Complaint  Patient presents with   Headache    Ayodeji Keimig is a 65 y.o. male.  The history is provided by the patient, the spouse and medical records. No language interpreter was used.  Headache Pain location:  R temporal and R parietal Quality:  Dull Radiates to:  R shoulder, R neck and R arm Severity currently:  8/10 Severity at highest:  10/10 Onset quality:  Gradual Duration:  1 day Timing:  Constant Progression:  Waxing and waning Chronicity:  New Context: bright light   Relieved by:  Acetaminophen Worsened by:  Light (arm movement) Ineffective treatments:  None tried Associated symptoms: neck pain (r lateral) and photophobia   Associated symptoms: no abdominal pain, no back pain, no blurred vision (pt has near blindness on R eye at baseline), no congestion, no diarrhea, no dizziness, no eye pain, no fatigue, no fever, no focal weakness, no hearing loss, no loss of balance, no nausea, no neck stiffness, no numbness, no paresthesias, no seizures, no sinus pressure, no URI, no visual change, no vomiting and no weakness       Past Medical History:  Diagnosis Date   Coronary artery disease    High cholesterol     There are no problems to display for this patient.   Past Surgical History:  Procedure Laterality Date   CORONARY ANGIOPLASTY WITH STENT PLACEMENT     LUNG TRANSPLANT, DOUBLE     NISSEN FUNDOPLICATION         History reviewed. No pertinent family history.  Social History   Tobacco Use   Smoking status: Never    Passive exposure: Never   Smokeless tobacco: Never  Vaping Use   Vaping Use: Never used  Substance Use Topics   Alcohol use: No   Drug use: Never    Home Medications Prior to Admission medications   Medication Sig Start Date End Date Taking? Authorizing Provider  aspirin 81 MG chewable  tablet Chew 81 mg by mouth daily.    [provider]  atorvastatin (LIPITOR) 20 MG tablet Take 20 mg by mouth daily.    [provider]  calcium-vitamin D (OSCAL WITH D) 250-125 MG-UNIT per tablet Take 2 tablets by mouth 2 (two) times daily.    [provider]  clopidogrel (PLAVIX) 75 MG tablet Take 75 mg by mouth daily.    [provider]  ezetimibe (ZETIA) 10 MG tablet Take 10 mg by mouth daily.    [provider]  ferrous sulfate 325 (65 FE) MG tablet Take 325 mg by mouth 2 (two) times daily with a meal.    [provider]  guaiFENesin (MUCINEX) 600 MG 12 hr tablet Take 600 mg by mouth 2 (two) times daily as needed for cough or to loosen phlegm.    [provider]  HYDROcodone-acetaminophen (NORCO/VICODIN) 5-325 MG per tablet Take 1 tablet by mouth every 6 (six) hours as needed for moderate pain or severe pain. Patient not taking: Reported on 10/26/2014 04/19/14   Antony Madura, PA-C  ibandronate (BONIVA) 150 MG tablet Take 150 mg by mouth every 30 (thirty) days. Take in the morning with a full glass of water, on an empty stomach, and do not take anything else by mouth or lie down for the next 30 min.    [provider]  latanoprost (XALATAN) 0.005 % ophthalmic  solution Place 2 drops into both eyes at bedtime.    [provider]  levofloxacin (LEVAQUIN) 750 MG tablet Take 1 tablet (750 mg total) by mouth daily. Patient not taking: Reported on 06/15/2015 11/23/14   [name], PA-C  lidocaine (XYLOCAINE) 2 % solution Use as directed 15 mLs in the mouth or throat every 4 (four) hours as needed for mouth pain. 06/08/19   Garlon Hatchet, PA-C  metoprolol tartrate (LOPRESSOR) 25 MG tablet Take 25 mg by mouth 2 (two) times daily.    [provider]  Multiple Vitamin (MULTIVITAMIN WITH MINERALS) TABS tablet Take 1 tablet by mouth daily.    [provider]  Multiple Vitamins-Minerals (PRESERVISION AREDS 2 PO)  Take 1 tablet by mouth daily.    [provider]  nitroGLYCERIN (NITROSTAT) 0.4 MG SL tablet Place 0.4 mg under the tongue every 5 (five) minutes as needed for chest pain.    [provider]  predniSONE (DELTASONE) 5 MG tablet Take 5 mg by mouth daily with breakfast.    [provider]  sulfamethoxazole-trimethoprim (BACTRIM,SEPTRA) 400-80 MG tablet Take 1 tablet by mouth daily.    [provider]  tacrolimus (PROGRAF) 1 MG capsule Take 6 mg by mouth 2 (two) times daily.     [provider]    Allergies    Patient has no known allergies.  Review of Systems   Review of Systems  Constitutional:  Negative for chills, fatigue and fever.  HENT:  Negative for congestion, hearing loss and sinus pressure.   Eyes:  Positive for photophobia. Negative for blurred vision (pt has near blindness on R eye at baseline) and pain.  Respiratory:  Negative for choking, chest tightness, shortness of breath and wheezing.   Cardiovascular:  Negative for chest pain.  Gastrointestinal:  Negative for abdominal pain, constipation, diarrhea, nausea and vomiting.  Genitourinary:  Negative for flank pain.  Musculoskeletal:  Positive for neck pain (r lateral). Negative for back pain and neck stiffness.  Skin:  Negative for rash and wound.  Neurological:  Positive for headaches. Negative for dizziness, focal weakness, seizures, weakness, light-headedness, numbness, paresthesias and loss of balance.  Psychiatric/Behavioral:  Negative for agitation and confusion.   All other systems reviewed and are negative.  Physical Exam Updated Vital Signs BP (!) 152/80   Pulse 74   Temp 98.9 F (37.2 C) (Oral)   Resp 18   Ht 6\' 2"  (1.88 m)   Wt 77.1 kg   SpO2 100%   BMI 21.83 kg/m   Physical Exam Vitals and nursing note reviewed.  Constitutional:      General: He is not in acute distress.    Appearance: He is well-developed. He is not ill-appearing, toxic-appearing or  diaphoretic.  HENT:     Head: Normocephalic and atraumatic.     Mouth/Throat:     Mouth: Mucous membranes are moist.  Eyes:     Extraocular Movements: Extraocular movements intact.     Conjunctiva/sclera: Conjunctivae normal.  Cardiovascular:     Rate and Rhythm: Normal rate and regular rhythm.     Heart sounds: No murmur heard. Pulmonary:     Effort: Pulmonary effort is normal. No respiratory distress.     Breath sounds: Normal breath sounds. No wheezing, rhonchi or rales.  Chest:     Chest wall: No tenderness.  Abdominal:     Palpations: Abdomen is soft.     Tenderness: There is no abdominal tenderness.  Musculoskeletal:  Cervical back: Neck supple. No rigidity.  Skin:    General: Skin is warm and dry.     Capillary Refill: Capillary refill takes less than 2 seconds.  Neurological:     Mental Status: He is alert and oriented to person, place, and time. Mental status is at baseline.     Sensory: No sensory deficit.     Motor: No weakness.     Coordination: Romberg sign negative.  Psychiatric:        Mood and Affect: Mood normal. Mood is not anxious.    ED Results / Procedures / Treatments   Labs (all labs ordered are listed, but only abnormal results are displayed) Labs Reviewed  COMPREHENSIVE METABOLIC PANEL - Abnormal; Notable for the following components:      Result Value   Glucose, Bld 148 (*)    All other components within normal limits  CBC - Abnormal; Notable for the following components:   WBC 2.8 (*)    RBC 3.19 (*)    Hemoglobin 11.1 (*)    HCT 32.6 (*)    MCV 102.2 (*)    MCH 34.8 (*)    Platelets 126 (*)    All other components within normal limits  DIFFERENTIAL - Abnormal; Notable for the following components:   Neutro Abs 1.4 (*)    All other components within normal limits  APTT - Abnormal; Notable for the following components:   aPTT 37 (*)    All other components within normal limits  PROTIME-INR    EKG None ED ECG REPORT   Date:  01/14/2021  Rate: 66  Rhythm: normal sinus rhythm  QRS Axis: normal  Intervals: normal  ST/T Wave abnormalities: nonspecific T wave changes  Conduction Disutrbances: 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   Narrative Interpretation:   Old EKG Reviewed: unchanged  I have personally reviewed the EKG tracing and agree with the computerized printout as noted.   Radiology CT HEAD WO CONTRAST  Result Date: 01/14/2021 CLINICAL DATA:  Headache. EXAM: CT HEAD WITHOUT CONTRAST TECHNIQUE: Contiguous axial images were obtained from the base of the skull through the vertex without intravenous contrast. COMPARISON:  November 23, 2014. FINDINGS: Brain: Mild chronic ischemic white matter disease is noted. No mass effect or midline shift is noted. Ventricular size is within normal limits. There is no evidence of mass lesion, hemorrhage or acute infarction. Vascular: No hyperdense vessel or unexpected calcification. Skull: Normal. Negative for fracture or focal lesion. Sinuses/Orbits: No acute finding. Other: None. IMPRESSION: No acute intracranial abnormality seen. Electronically Signed   By: Lupita Raider M.D.   On: 01/14/2021 15:05    Procedures Procedures   Medications Ordered in ED Medications  prochlorperazine (COMPAZINE) injection 10 mg (10 mg Intravenous Given 01/14/21 1758)  diphenhydrAMINE (BENADRYL) injection 50 mg (50 mg Intravenous Given 01/14/21 1758)  sodium chloride 0.9 % bolus 1,000 mL (0 mLs Intravenous Stopped 01/14/21 1825)    ED Course  I have reviewed the triage vital signs and the nursing notes.  Pertinent labs & imaging results that were available during my care of the patient were reviewed by me and considered in my medical decision making (see chart for details).    MDM Rules/Calculators/A&P                           Filomeno Cromley is a 65 y.o. male with a past medical history significant for CAD with PCI, hypercholesterolemia, 65 year old  lung  transplant, Nissen fundoplication, and chronic right eye blindness who presents with headache and right arm soreness.  Patient reports that he carved a pumpkin yesterday and he is right-handed.  He does admit that his right arm felt sore from using it and the soreness did feel like it went to his right neck and onto the right side of his head.  He said he woke up overnight with more severe pain in his right head.  He does have photophobia.  He denied any vision changes but he reports his right eye is blind at baseline.  He reports no nausea, vomiting, speech difficulties, or any numbness, tingling, weakness of extremities.  No reported dizziness or unsteadiness.  No reported trauma.  No rashes on his face reported.  History of facial shingles.  No history of vertebral dissection and no anterior midline neck pain.  No difficulty breathing or swallowing.  He otherwise denies any fevers, chills, chest pain, shortness of breath, constipation, diarrhea, or urinary changes.  He reports that he took some Tylenol which helped his symptoms and he is now an 8 out of 10 in severity down from a 10 out of 10.  He reports that it was not maximal at onset and was gradual in onset.  He has no history of intracranial hemorrhage or cancer to his knowledge.   On exam, lungs clear and chest nontender.  Right arm had some soreness when he moved around in the shoulder girdle but there was no bony tenderness.  Symmetric strength, sensation, pulses, and finger-nose-finger testing.  Normal strength and sensation in the legs.  Some mild lateral neck tenderness but no carotid bruit appreciated.  No pain with neck movement or manipulation.  Pupils are symmetric and reactive less so on the right but he reports he is blind in the right eye and can barely see anything.  He reports it is unchanged.  His temporal area is nontender and there is no rash seen to suggest shingles.  Doubt temporal arteritis without tenderness.  Had a shared  decision conversation with patient.  Patient had a work-up started in triage including labs and a CT of the head.  CT was completely reassuring with no evidence of intracranial hemorrhage mass, or evidence of stroke.  Patient's labs overall reassuring and were as expected for his immunosuppression use.  We discussed management and clinically we have low suspicion for temporal arteritis with lack of tenderness, low suspicion for stroke based on exam and description of gradually worsening headache as opposed to severe in onset headache, low suspicion for meningitis based on history and exam, and low concern for a vertebral artery dissection at this time.  We agreed to hold on further work-up and do not feel patient needs LP at this time.  We agreed to give the patient a headache cocktail and reassess to see if he is feeling better now that his work-up is reassuring.  Overall I suspect his right arm was sore from the aggressive punk and carving and this led to some muscle tension leading up his neck towards his head causing this headache.  The photophobia component also makes me suspicious for a migrainous part of the headache.  If work-up is reassuring and he is feeling better after medications, anticipate discharge to follow-up with PCP.  Patient had resolution of his headache.  He did have some mild agitation with the Compazine but the Benadryl helped resolve it.  He is now feeling completely normal and agrees with discharge  home.  He will follow-up with his PCP and understood return precautions.  He had no other Questions or concerns and was discharged in good condition.    Final Clinical Impression(s) / ED Diagnoses Final diagnoses:  Acute nonintractable headache, unspecified headache type  Pain of right upper extremity    Rx / DC Orders ED Discharge Orders     None      Clinical Impression: 1. Acute nonintractable headache, unspecified headache type   2. Pain of right upper  extremity     Disposition: Discharge  Condition: Good  I have discussed the results, Dx and Tx plan with the pt(& family if present). He/she/they expressed understanding and agree(s) with the plan. Discharge instructions discussed at great length. Strict return precautions discussed and pt &/or family have verbalized understanding of the instructions. No further questions at time of discharge.    New Prescriptions   No medications on file    Follow Up: Cleatis Polka., MD [address] [place] [address] [phone]     [facility] [address] [place] [address] [phone]        Takyra Cantrall, Canary Brim, MD 01/14/21 2132

## 2021-01-14 NOTE — ED Notes (Signed)
Pt reports feeling like he is "about to pass out", pt shivering.  Dr. Rush Landmark notified and came to bedside.  Vitals:  Hr 73, RA sat 100%, Resp 24, BP 178/87, Temp 97.9.  D/C NS bolus per Dr. Rush Landmark.  Continue to monitor.

## 2021-08-16 ENCOUNTER — Encounter (HOSPITAL_BASED_OUTPATIENT_CLINIC_OR_DEPARTMENT_OTHER): Payer: Self-pay | Admitting: Emergency Medicine

## 2021-08-16 ENCOUNTER — Emergency Department (HOSPITAL_BASED_OUTPATIENT_CLINIC_OR_DEPARTMENT_OTHER)
Admission: EM | Admit: 2021-08-16 | Discharge: 2021-08-16 | Disposition: A | Discharge status: Home / Self Care | Payer: Medicare Other | Attending: Emergency Medicine | Admitting: Emergency Medicine

## 2021-08-16 ENCOUNTER — Emergency Department (HOSPITAL_BASED_OUTPATIENT_CLINIC_OR_DEPARTMENT_OTHER): Payer: Medicare Other

## 2021-08-16 ENCOUNTER — Other Ambulatory Visit: Payer: Self-pay

## 2021-08-16 DIAGNOSIS — X500XXA Overexertion from strenuous movement or load, initial encounter: Secondary | ICD-10-CM | POA: Insufficient documentation

## 2021-08-16 DIAGNOSIS — M545 Low back pain, unspecified: Secondary | ICD-10-CM | POA: Diagnosis present

## 2021-08-16 DIAGNOSIS — Z7982 Long term (current) use of aspirin: Secondary | ICD-10-CM | POA: Diagnosis not present

## 2021-08-16 DIAGNOSIS — S39012A Strain of muscle, fascia and tendon of lower back, initial encounter: Secondary | ICD-10-CM | POA: Insufficient documentation

## 2021-08-16 DIAGNOSIS — S161XXA Strain of muscle, fascia and tendon at neck level, initial encounter: Secondary | ICD-10-CM | POA: Insufficient documentation

## 2021-08-16 MED ORDER — LIDOCAINE 5 % EX PTCH: 1.0000 | MEDICATED_PATCH | CUTANEOUS | 0 refills | Status: DC

## 2021-08-16 MED ORDER — MELOXICAM 15 MG PO TABS: 15.0000 mg | ORAL_TABLET | Freq: Every day | ORAL | 0 refills | Status: DC

## 2021-08-16 NOTE — ED Provider Notes (Signed)
[facility]   CSN: [id number] Arrival date & time: 08/16/21  1210     History  Chief Complaint  Patient presents with   Back Pain    Jacob Erickson is a 66 y.o. 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  He was seen by his doctor who ordered Robaxin, encouraged him to use Tylenol, he reports no significant improvement with these medications.  He reports that he did get some rest with muscle relaxant but is not solving the problem.  Has not been taking ibuprofen because he takes Plavix.  Patient reports pain is worse at night and with attempting to sit up.  He denies numbness, tingling, saddle anesthesia, difficulty with urination, defecation, increasing weakness.   Back Pain     Home Medications Prior to Admission medications   Medication Sig Start Date End Date Taking? Authorizing Provider  lidocaine (LIDODERM) 5 % Place 1 patch onto the skin daily. Remove & Discard patch within 12 hours or as directed by MD 08/16/21  Yes Macala Baldonado H, PA-C  meloxicam (MOBIC) 15 MG tablet Take 1 tablet (15 mg total) by mouth daily. 08/16/21  Yes Rithik Odea H, PA-C  aspirin 81 MG chewable tablet Chew 81 mg by mouth daily.    [provider]  atorvastatin (LIPITOR) 20 MG tablet Take 20 mg by mouth daily.    [provider]  calcium-vitamin D (OSCAL WITH D) 250-125 MG-UNIT per tablet Take 2 tablets by mouth 2 (two) times daily.    [provider]  ezetimibe (ZETIA) 10 MG tablet Take 10 mg by mouth daily.    [provider]  ferrous sulfate 325 (65 FE) MG tablet Take 325 mg by mouth 2 (two) times daily with a meal.    [provider]  guaiFENesin (MUCINEX) 600 MG 12 hr tablet Take 600 mg by mouth 2 (two) times daily as needed for cough or to loosen phlegm.    [provider]  HYDROcodone-acetaminophen (NORCO/VICODIN) 5-325 MG per tablet Take 1 tablet by mouth every 6 (six) hours as needed for moderate pain or severe pain. Patient not taking: Reported on 10/26/2014 04/19/14   Antony MaduraHumes, Kelly, PA-C  ibandronate (BONIVA) 150 MG tablet Take 150 mg by mouth every 30 (thirty) days. Take in the morning with a full glass of water, on an empty stomach, and do not take anything else by mouth or lie down for the next 30 min.    [provider]  latanoprost (XALATAN) 0.005 % ophthalmic solution Place 2 drops into both eyes at bedtime.    [provider]  levofloxacin (LEVAQUIN) 750 MG tablet Take 1 tablet (750 mg total) by mouth daily. Patient not taking: Reported on 06/15/2015 11/23/14   Danelle Berryapia, Leisa, PA-C  lidocaine (XYLOCAINE) 2 % solution Use as directed 15 mLs in the mouth or throat every 4 (four) hours as needed for mouth pain. 06/08/19   Garlon HatchetSanders, Lisa M, PA-C  metoprolol tartrate (LOPRESSOR) 25 MG tablet Take 25 mg by mouth 2 (two) times daily.    [provider]  Multiple Vitamin (MULTIVITAMIN WITH MINERALS) TABS tablet Take 1 tablet by mouth daily.    [provider]  Multiple Vitamins-Minerals (PRESERVISION  AREDS 2 PO) Take 1 tablet by mouth daily.    [provider]  nitroGLYCERIN (NITROSTAT) 0.4 MG SL tablet Place 0.4 mg under the tongue every 5 (five) minutes as needed for chest pain.    [provider]  predniSONE (DELTASONE) 5 MG tablet Take 5 mg by mouth daily with breakfast.    [provider]  sulfamethoxazole-trimethoprim (BACTRIM,SEPTRA) 400-80 MG tablet Take 1 tablet by mouth daily.    [provider]  tacrolimus (PROGRAF) 1 MG capsule Take 6 mg by mouth 2 (two) times daily.     [provider]      Allergies    Patient has  no known allergies.    Review of Systems   Review of Systems  Musculoskeletal:  Positive for back pain.  All other systems reviewed and are negative.  Physical Exam Updated Vital Signs BP (!) 151/86 (BP Location: Right Arm)   Pulse 62   Temp 98.2 F (36.8 C) (Oral)   Resp 18   Ht  (1.88 m)   Wt 77.1 kg   SpO2 100%   BMI 21.83 kg/m  Physical Exam Vitals and nursing note reviewed.  Constitutional:      General: He is not in acute distress.    Appearance: Normal appearance.  HENT:     Head: Normocephalic and atraumatic.  Eyes:     General:        Right eye: No discharge.        Left eye: No discharge.  Cardiovascular:     Rate and Rhythm: Normal rate and regular rhythm.     Heart sounds: No murmur heard.   No friction rub. No gallop.  Pulmonary:     Effort: Pulmonary effort is normal.     Breath sounds: Normal breath sounds.  Abdominal:     General: Bowel sounds are normal.     Palpations: Abdomen is soft.  Musculoskeletal:     Comments: Tenderness palpation the midline cervical spine as well as lumbar spine without obvious step-off or deformity noted.  There also is significant tenderness palpation of the lumbar and cervical paraspinous muscles.  Intact strength 5 out of 5 bilateral upper and lower extremities.  Skin:    General: Skin is warm and dry.     Capillary Refill: Capillary refill takes less than 2 seconds.  Neurological:     Mental Status: He is alert and oriented to person, place, and time.  Psychiatric:        Mood and Affect: Mood normal.        Behavior: Behavior normal.    ED Results / Procedures / Treatments   Labs (all labs ordered are listed, but only abnormal results are displayed) Labs Reviewed - No data to display  EKG None  Radiology CT Cervical Spine Wo Contrast  Result Date: 08/16/2021 CLINICAL DATA:  Patient trying to lift a heavy box and hurt his back being pulled. Persistent neck and low back pain. EXAM: CT CERVICAL SPINE  WITHOUT CONTRAST TECHNIQUE: Multidetector CT imaging of the cervical spine was performed without intravenous contrast. Multiplanar CT image reconstructions were also generated. RADIATION DOSE REDUCTION: This exam was performed according to the departmental dose-optimization program which includes automated exposure control, adjustment of the mA and/or kV according to patient size and/or use of iterative reconstruction technique. COMPARISON:  None Available. FINDINGS: Alignment: Normal. Skull base and vertebrae: No acute fracture. No primary bone lesion or focal pathologic process. 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 Soft tissues and spinal canal: No prevertebral fluid or swelling. No visible canal hematoma. Disc levels: Mild loss of disc height at C4-C5 and C5-C6. Mild disc bulging and endplate spurring. No convincing disc herniation. Upper chest: No acute or significant abnormality. Other: None. IMPRESSION: 1. No fracture or acute finding. 2. Mild disc degenerative changes. No evidence of a disc herniation. Electronically Signed   By: Amie Portland M.D.   On: 08/16/2021 15:54   CT Lumbar Spine Wo Contrast  Result Date: 08/16/2021 CLINICAL DATA:  Low back pain, increased fracture risk EXAM: CT LUMBAR SPINE WITHOUT CONTRAST TECHNIQUE: Multidetector CT imaging of the lumbar spine was performed without intravenous contrast administration. Multiplanar CT image reconstructions were also generated. RADIATION DOSE REDUCTION: This exam was performed according to the departmental dose-optimization program which includes automated exposure control, adjustment of the mA and/or kV according to patient size and/or use of iterative reconstruction technique. COMPARISON:  Lumbar radiographs 10/06/2010. FINDINGS: Segmentation: 5 non rib-bearing lumbar vertebral bodies. Alignment: No substantial sagittal subluxation. Vertebrae: Deformity of the L3 and L4 superior endplates, increased in conspicuity at L4 and new  at L3 since remote 2012 radiographs. There is no associated trabecular sclerosis or discrete lucency. Paraspinal and other soft tissues: Negative. Disc levels: Multilevel degenerative change without evidence of high-grade bony canal or foraminal stenosis. IMPRESSION: 1. 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 An MRI could assess for associated bone marrow edema if clinically warranted. 2. Osteopenia. Electronically Signed   By: Feliberto Harts M.D.   On: 08/16/2021 16:04    Procedures Procedures    Medications Ordered in ED Medications - No data to display  ED Course/ Medical Decision Making/ A&P                           Medical Decision Making Amount and/or Complexity of Data Reviewed Radiology: ordered.  Risk Prescription drug management.   This patient is a 66 y.o. male who presents to the ED for concern of lumbar and cervical back pain after lifting injury, this involves an extensive number of treatment options, and is a complaint that carries with it a high risk of complications and morbidity. The emergent differential diagnosis prior to evaluation includes, but is not limited to, herniated disc, spondylolisthesis, compression fracture, Less concern for epidural abscess, osteomyelitis based on patient risk factors and presentation.   This is not an exhaustive differential.   Past Medical History / Co-morbidities / Social History: Bilateral lung transplant, chronically on steroids  Additional history: Chart reviewed. Pertinent results include: Reviewed Outpatient transplant medicine, pulmonology visits  Physical Exam: Physical exam performed. The pertinent findings include: No neurologic compromise noted on physical exam , Intact strength 5 out of 5 bilateral upper and lower extremities.  Patient does have some midline spinal tenderness in the cervical and lumbar spine as well  as paraspinous muscle tenderness   Imaging Studies: I ordered imaging studies including CT lumbar spine without contrast, CT cervical spine without contrast. I independently visualized and interpreted imaging which showed is noted but no evidence of acute fracture, spondylolisthesis, herniated disc. I agree with the radiologist interpretation.  Disposition: After consideration of the diagnostic results and the patients response to treatment, I feel that cervical, lumbar strain in context of his lifting injury. He does report that he has stopped taking Plavix so we can try some  anti-inflammatory measures.  We will try Mobic as well as lidocaine patches in addition to continuing patient's muscle relaxant, and Tylenol regimen.  Encourage follow-up with orthopedics if pain fails to improve despite treatment..   I discussed this case with my attending physician Dr. Criss Alvine who cosigned this note including patient's presenting symptoms, physical exam, and planned diagnostics and interventions. Attending physician stated agreement with plan or made changes to plan which were implemented.    Final Clinical Impression(s) / ED Diagnoses Final diagnoses:  Strain of lumbar region, initial encounter  Strain of neck muscle, initial encounter    Rx / DC Orders ED Discharge Orders          Ordered    meloxicam (MOBIC) 15 MG tablet  Daily        08/16/21 1622    lidocaine (LIDODERM) 5 %  Every 24 hours        08/16/21 1622              Maguadalupe Lata, [place], PA-C 08/16/21 1735    Pricilla Loveless, MD 08/19/21 [id number]

## 2021-08-16 NOTE — Discharge Instructions (Addendum)
You can use lidocaine patches to the affected area.  I have attached some rehabilitation exercises that can help with the pain.  Additionally please continue use Tylenol 1000 mg every 6 hours, your muscle relaxant up to twice daily as needed, and you can add on the new medications which is Mobic and anti-inflammatory you can take once daily.  Additionally you can use the lidocaine patches I prescribed.  If your pain persists despite all of the above I recommend following up with orthopedic doctor or your primary care physician.  Please return to the emergency department if your pain significantly worsens, especially if you have numbness, tingling, difficulty with urination or defecation.

## 2021-08-16 NOTE — ED Triage Notes (Signed)
Pt was trying to lift 150 lb box, and heard his back pulled. Happened 5/20.Dr. Clelia Croft MD ordered him robaxin, has helped only a little bit. Pt double lung transplant.

## 2021-12-16 ENCOUNTER — Emergency Department (HOSPITAL_BASED_OUTPATIENT_CLINIC_OR_DEPARTMENT_OTHER)
Admission: EM | Admit: 2021-12-16 | Discharge: 2021-12-16 | Disposition: A | Discharge status: Home / Self Care | Payer: Medicare Other | Attending: Emergency Medicine | Admitting: Emergency Medicine

## 2021-12-16 ENCOUNTER — Other Ambulatory Visit: Payer: Self-pay

## 2021-12-16 ENCOUNTER — Emergency Department (HOSPITAL_BASED_OUTPATIENT_CLINIC_OR_DEPARTMENT_OTHER): Payer: Medicare Other | Admitting: Radiology

## 2021-12-16 ENCOUNTER — Other Ambulatory Visit (HOSPITAL_BASED_OUTPATIENT_CLINIC_OR_DEPARTMENT_OTHER): Payer: Self-pay

## 2021-12-16 ENCOUNTER — Encounter (HOSPITAL_BASED_OUTPATIENT_CLINIC_OR_DEPARTMENT_OTHER): Payer: Self-pay

## 2021-12-16 DIAGNOSIS — U071 COVID-19: Secondary | ICD-10-CM | POA: Diagnosis not present

## 2021-12-16 DIAGNOSIS — Z8616 Personal history of COVID-19: Secondary | ICD-10-CM | POA: Insufficient documentation

## 2021-12-16 DIAGNOSIS — R059 Cough, unspecified: Secondary | ICD-10-CM | POA: Diagnosis present

## 2021-12-16 DIAGNOSIS — Z7982 Long term (current) use of aspirin: Secondary | ICD-10-CM | POA: Insufficient documentation

## 2021-12-16 DIAGNOSIS — D649 Anemia, unspecified: Secondary | ICD-10-CM | POA: Diagnosis not present

## 2021-12-16 LAB — CBC WITH DIFFERENTIAL/PLATELET
Abs Immature Granulocytes: 0.02 10*3/uL (ref 0.00–0.07)
Basophils Absolute: 0 10*3/uL (ref 0.0–0.1)
Basophils Relative: 0 %
Eosinophils Absolute: 0.1 10*3/uL (ref 0.0–0.5)
Eosinophils Relative: 2 %
HCT: 36.7 % — ABNORMAL LOW (ref 39.0–52.0)
Hemoglobin: 12.6 g/dL — ABNORMAL LOW (ref 13.0–17.0)
Immature Granulocytes: 0 %
Lymphocytes Relative: 28 %
Lymphs Abs: 1.3 10*3/uL (ref 0.7–4.0)
MCH: 35.1 pg — ABNORMAL HIGH (ref 26.0–34.0)
MCHC: 34.3 g/dL (ref 30.0–36.0)
MCV: 102.2 fL — ABNORMAL HIGH (ref 80.0–100.0)
Monocytes Absolute: 1 10*3/uL (ref 0.1–1.0)
Monocytes Relative: 23 %
Neutro Abs: 2.2 10*3/uL (ref 1.7–7.7)
Neutrophils Relative %: 47 %
Platelets: 112 10*3/uL — ABNORMAL LOW (ref 150–400)
RBC: 3.59 MIL/uL — ABNORMAL LOW (ref 4.22–5.81)
RDW: 12.4 % (ref 11.5–15.5)
WBC: 4.6 10*3/uL (ref 4.0–10.5)
nRBC: 0 % (ref 0.0–0.2)

## 2021-12-16 LAB — BASIC METABOLIC PANEL
Anion gap: 11 (ref 5–15)
BUN: 19 mg/dL (ref 8–23)
CO2: 28 mmol/L (ref 22–32)
Calcium: 9.3 mg/dL (ref 8.9–10.3)
Chloride: 100 mmol/L (ref 98–111)
Creatinine, Ser: 1.19 mg/dL (ref 0.61–1.24)
GFR, Estimated: >60 mL/min (ref >60)
Glucose, Bld: 93 mg/dL (ref 70–99)
Potassium: 4.4 mmol/L (ref 3.5–5.1)
Sodium: 139 mmol/L (ref 135–145)

## 2021-12-16 LAB — RESP PANEL BY RT-PCR (FLU A&B, COVID) ARPGX2
Influenza A by PCR: NEGATIVE
Influenza B by PCR: NEGATIVE
SARS Coronavirus 2 by RT PCR: POSITIVE — AB

## 2021-12-16 MED ORDER — MOLNUPIRAVIR EUA 200MG CAPSULE
4.0000 | ORAL_CAPSULE | Freq: Two times a day (BID) | ORAL | 0 refills | Status: AC
  Filled 2021-12-16: qty 40, 5d supply, fill #0

## 2021-12-16 NOTE — ED Provider Notes (Signed)
[facility] EMERGENCY DEPT Provider Note   CSN: [id number] Arrival date & time: 12/16/21  1003     History Chief Complaint  Patient presents with   COVID-19 Exposure    Jacob Erickson is a 66 y.o. male h/o double lung transplant presents to the ED for evaluation of after being exposed to COVID.  Patient reports that his wife's friend who had COVID was around him for around an hour.  He reports that he was concerned that he may have COVID.  Patient reports that he was nervous because he was admitted last year for COVID.  He reports he has had a very minimal cough this nonproductive and was only present on the first day and has not had it for a few days.  Reports again the minimal headache that was for a few hours few days ago that is since passed.  He denies any shortness of breath, chest pain, fevers, runny nose, nasal congestion, abdominal pain, nausea, vomiting, diarrhea, or constipation.  Denies any unilateral weakness or trouble walking or talking.  No medications trialed.  HPI     Home Medications Prior to Admission medications   Medication Sig Start Date End Date Taking? Authorizing Provider  aspirin 81 MG chewable tablet Chew 81 mg by mouth daily.    [provider]  atorvastatin (LIPITOR) 20 MG tablet Take 20 mg by mouth daily.    [provider]  calcium-vitamin D (OSCAL WITH D) 250-125 MG-UNIT per tablet Take 2 tablets by mouth 2 (two) times daily.    [provider]  ezetimibe (ZETIA) 10 MG tablet Take 10 mg by mouth daily.    [provider]  ferrous sulfate 325 (65 FE) MG tablet Take 325 mg by mouth 2 (two) times daily with a meal.    [provider]  guaiFENesin (MUCINEX) 600 MG 12 hr tablet Take 600 mg by mouth 2 (two) times daily as needed for cough or to loosen phlegm.    [provider]  HYDROcodone-acetaminophen (NORCO/VICODIN) 5-325 MG per tablet Take 1 tablet by mouth every 6 (six) hours as  needed for moderate pain or severe pain. Patient not taking: Reported on 10/26/2014 04/19/14   Antony Madura, PA-C  ibandronate (BONIVA) 150 MG tablet Take 150 mg by mouth every 30 (thirty) days. Take in the morning with a full glass of water, on an empty stomach, and do not take anything else by mouth or lie down for the next 30 min.    [provider]  latanoprost (XALATAN) 0.005 % ophthalmic solution Place 2 drops into both eyes at bedtime.    [provider]  levofloxacin (LEVAQUIN) 750 MG tablet Take 1 tablet (750 mg total) by mouth daily. Patient not taking: Reported on 06/15/2015 11/23/14   [name], PA-C  lidocaine (LIDODERM) 5 % Place 1 patch onto the skin daily. Remove & Discard patch within 12 hours or as directed by MD 08/16/21   Prosperi, Ephriam Knuckles H, PA-C  lidocaine (XYLOCAINE) 2 % solution Use as directed 15 mLs in the mouth or throat every 4 (four) hours as needed for mouth pain. 06/08/19   Garlon Hatchet, PA-C  meloxicam (MOBIC) 15 MG tablet Take 1 tablet (15 mg total) by mouth daily. 08/16/21   Prosperi, Christian H, PA-C  metoprolol tartrate (LOPRESSOR) 25 MG tablet Take 25 mg by mouth 2 (two) times daily.    [provider]  Multiple Vitamin (MULTIVITAMIN WITH MINERALS) TABS tablet Take 1 tablet by  mouth daily.    [provider]  Multiple Vitamins-Minerals (PRESERVISION AREDS 2 PO) Take 1 tablet by mouth daily.    [provider]  nitroGLYCERIN (NITROSTAT) 0.4 MG SL tablet Place 0.4 mg under the tongue every 5 (five) minutes as needed for chest pain.    [provider]  predniSONE (DELTASONE) 5 MG tablet Take 5 mg by mouth daily with breakfast.    [provider]  sulfamethoxazole-trimethoprim (BACTRIM,SEPTRA) 400-80 MG tablet Take 1 tablet by mouth daily.    [provider]  tacrolimus (PROGRAF) 1 MG capsule Take 6 mg by mouth 2 (two) times daily.     [provider]      Allergies    Patient has no  known allergies.    Review of Systems   Review of Systems  Constitutional:  Negative for chills and fever.  HENT:  Negative for congestion and rhinorrhea.   Respiratory:  Positive for cough. Negative for shortness of breath.   Cardiovascular:  Negative for chest pain.  Gastrointestinal:  Negative for abdominal pain, nausea and vomiting.  Genitourinary:  Negative for dysuria and hematuria.  Musculoskeletal:  Negative for back pain and myalgias.  Neurological:  Positive for headaches. Negative for weakness.    Physical Exam Updated Vital Signs BP (!) 147/75   Pulse 81   Temp 98.2 F (36.8 C)   Resp 18   Ht 6\' 2"  (1.88 m)   Wt 77.1 kg   SpO2 100%   BMI 21.82 kg/m  Physical Exam Vitals and nursing note reviewed.  Constitutional:      General: He is not in acute distress.    Appearance: Normal appearance. He is not ill-appearing or toxic-appearing.     Comments: Patient well-appearing, no acute distress  HENT:     Head: Normocephalic and atraumatic.     Mouth/Throat:     Mouth: Mucous membranes are moist.  Eyes:     General: No scleral icterus. Cardiovascular:     Rate and Rhythm: Normal rate and regular rhythm.     Pulses: Normal pulses.  Pulmonary:     Effort: Pulmonary effort is normal. No respiratory distress.     Breath sounds: Normal breath sounds.     Comments: Lungs are clear to auscultation bilaterally.  Patient is speaking full sentence with ease and is satting well on room air with any increased work of breathing. Abdominal:     General: Bowel sounds are normal.     Palpations: Abdomen is soft.     Tenderness: There is no abdominal tenderness. There is no guarding or rebound.  Musculoskeletal:        General: No deformity.     Cervical back: Normal range of motion. No tenderness.  Lymphadenopathy:     Cervical: No cervical adenopathy.  Skin:    General: Skin is warm and dry.  Neurological:     General: No focal deficit present.     Mental Status: He is  alert. Mental status is at baseline.     Cranial Nerves: No cranial nerve deficit.     ED Results / Procedures / Treatments   Labs (all labs ordered are listed, but only abnormal results are displayed) Labs Reviewed  RESP PANEL BY RT-PCR (FLU A&B, COVID) ARPGX2 - Abnormal; Notable for the following components:      Result Value   SARS Coronavirus 2 by RT PCR POSITIVE (*)    All other components within normal limits  CBC WITH DIFFERENTIAL/PLATELET  BASIC METABOLIC PANEL    EKG None  Radiology DG Chest 2 View  Result Date: 12/16/2021 CLINICAL DATA:  COVID, double lung transplant EXAM: CHEST - 2 VIEW COMPARISON:  06/08/2019 FINDINGS: The heart size and mediastinal contours are within normal limits. Unchanged postoperative appearance of the chest status post clamshell sternotomy. Chronic elevation of the left hemidiaphragm. No acute airspace opacity. Age indeterminate wedge deformity of a lower thoracic vertebral body. IMPRESSION: 1. No acute abnormality of the lungs. 2. Age indeterminate wedge deformity of a lower thoracic vertebral body. Correlate for acute pain and tenderness. Electronically Signed   By: Jearld Lesch M.D.   On: 12/16/2021 14:26     Procedures Procedures   Medications Ordered in ED Medications - No data to display  ED Course/ Medical Decision Making/ A&P                           Medical Decision Making Amount and/or Complexity of Data Reviewed Labs: ordered. Radiology: ordered.   66 year old male presents to the emergency room for evaluation of COVID test.  Vital signs show slightly elevated blood pressure otherwise unremarkable.  Physical exam as noted above.  Patient tested positive for COVID.  Chest x-ray shows no acute abnormality of the lungs.  There is an age-indeterminate wedge deformity over the lower thoracic vertebral body.  Patient does not have any tenderness palpation of this area.  I independently reviewed and interpreted the patient's labs.   CBC shows mild decrease in hemoglobin of 12.6 although improved from previous.  No leukocytosis.  Chronic thrombocytopenia again present.  BMP without any electrolyte abnormality.  Spoke with Dr. Tawanna Cooler, with [facility], who recommended as long as the patient is stable that he should be ok to go home with molnupiravir 800mg  bid for the next 5 days.   At this time, patient is very well-appearing.  No acute distress.  Mildly elevated blood pressure otherwise unremarkable.  He speaking in full sentence with the ease, he has clear lung sounds.  He is mainly just concerned if he has COVID or not.  At this time, I think the patient safe for discharge given the use.  We did discuss strict return precautions for him to return.  I discussed with Dr. recommendation for the [facility] with the patient.  We discussed strict return precautions and red flag symptoms.  Patient verbalizes understanding and agrees to the plan.  Patient is being discharged home in good condition.  I discussed this case with my attending physician who cosigned this note including patient's presenting symptoms, physical exam, and planned diagnostics and interventions. Attending physician stated agreement with plan or made changes to plan which were implemented.   Final Clinical Impression(s) / ED Diagnoses Final diagnoses:  COVID    Rx / DC Orders ED Discharge Orders          Ordered    molnupiravir EUA (LAGEVRIO) 200 mg CAPS capsule  2 times daily        12/16/21 1554              12/18/21, Achille Rich 12/19/21 2355    12/21/21, MD 01/12/22 1154

## 2021-12-16 NOTE — Discharge Instructions (Signed)
You were seen in the ER for evaluation of your COVID results.  You tested positive for COVID.  After speaking to the [facility] transplant team, they would like he put on a medication called molnupiravir which you will take 800 mg twice daily for the next 5 days. Please follow up with your PCP in one week for re-evaluation. If you have any concerns, new or worsening symptoms, please return to the nearest ER for re-evaluation.   Get help right away if: You have trouble breathing. You have pain or pressure in your chest. You are confused. You have bluish lips and fingernails. You have trouble waking from sleep. You have symptoms that get worse. These symptoms may be an emergency. Get help right away. Call 911. Do not wait to see if the symptoms will go away. Do not drive yourself to the hospital.

## 2021-12-16 NOTE — ED Triage Notes (Signed)
Patient here POV from Home.  Endorses having an Exposure to a COVID-19 Positive Individual approximately 3 Days ago for approximately 1 Hour.  Home COVID-19 Test was Negative. Only Symptoms are a Mild Headache and Mild Cough that has since subsided.   NAD Noted during Triage. A&Ox4. GCS 15. Ambulatory.

## 2022-04-22 ENCOUNTER — Encounter: Payer: Self-pay | Admitting: Internal Medicine

## 2022-04-22 DIAGNOSIS — M546 Pain in thoracic spine: Secondary | ICD-10-CM

## 2022-04-23 ENCOUNTER — Encounter: Payer: Self-pay | Admitting: Internal Medicine

## 2022-04-23 ENCOUNTER — Other Ambulatory Visit (HOSPITAL_BASED_OUTPATIENT_CLINIC_OR_DEPARTMENT_OTHER): Payer: Self-pay | Admitting: Internal Medicine

## 2022-04-23 DIAGNOSIS — S22070A Wedge compression fracture of T9-T10 vertebra, initial encounter for closed fracture: Secondary | ICD-10-CM

## 2022-04-23 DIAGNOSIS — M4850XA Collapsed vertebra, not elsewhere classified, site unspecified, initial encounter for fracture: Secondary | ICD-10-CM

## 2022-04-24 ENCOUNTER — Ambulatory Visit (HOSPITAL_BASED_OUTPATIENT_CLINIC_OR_DEPARTMENT_OTHER)
Admission: RE | Admit: 2022-04-24 | Discharge: 2022-04-24 | Disposition: A | Discharge status: Home / Self Care | Payer: Medicare Other | Source: Ambulatory Visit | Attending: Internal Medicine | Admitting: Internal Medicine

## 2022-04-24 ENCOUNTER — Encounter (HOSPITAL_BASED_OUTPATIENT_CLINIC_OR_DEPARTMENT_OTHER): Payer: Self-pay

## 2022-04-24 DIAGNOSIS — S22070A Wedge compression fracture of T9-T10 vertebra, initial encounter for closed fracture: Secondary | ICD-10-CM | POA: Insufficient documentation

## 2022-04-24 HISTORY — DX: Essential (primary) hypertension: I10

## 2022-04-24 MED ORDER — GADOBUTROL 1 MMOL/ML IV SOLN
8.0000 mL | Freq: Once | INTRAVENOUS | Status: AC | PRN
  Administered 2022-04-24: 8 mL via INTRAVENOUS
  Filled 2022-04-24: qty 8

## 2022-04-28 ENCOUNTER — Other Ambulatory Visit: Payer: Self-pay | Admitting: Internal Medicine

## 2022-04-28 DIAGNOSIS — S22070A Wedge compression fracture of T9-T10 vertebra, initial encounter for closed fracture: Secondary | ICD-10-CM

## 2022-04-29 ENCOUNTER — Other Ambulatory Visit: Payer: Self-pay | Admitting: Internal Medicine

## 2022-04-29 DIAGNOSIS — S22070A Wedge compression fracture of T9-T10 vertebra, initial encounter for closed fracture: Secondary | ICD-10-CM

## 2022-04-29 DIAGNOSIS — M8000XA Age-related osteoporosis with current pathological fracture, unspecified site, initial encounter for fracture: Secondary | ICD-10-CM

## 2022-04-30 ENCOUNTER — Other Ambulatory Visit: Payer: Self-pay | Admitting: Internal Medicine

## 2022-04-30 ENCOUNTER — Ambulatory Visit
Admission: RE | Admit: 2022-04-30 | Discharge: 2022-04-30 | Disposition: A | Discharge status: Home / Self Care | Payer: Medicare Other | Source: Ambulatory Visit | Attending: Internal Medicine | Admitting: Internal Medicine

## 2022-04-30 DIAGNOSIS — S22070A Wedge compression fracture of T9-T10 vertebra, initial encounter for closed fracture: Secondary | ICD-10-CM

## 2022-04-30 DIAGNOSIS — M8000XA Age-related osteoporosis with current pathological fracture, unspecified site, initial encounter for fracture: Secondary | ICD-10-CM

## 2022-04-30 HISTORY — PX: IR RADIOLOGIST EVAL & MGMT: IMG5224

## 2022-04-30 NOTE — Consult Note (Signed)
Chief Complaint: Patient was seen in consultation today for thoracic spine pain at the request of Shaw,William Dimas Aguas.  Referring Physician(s): Shaw,William D Jr.  History of Present Illness: Jacob Erickson is a 67 y.o. 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  He was in his usual state of good health and activity when he picked up a heavy package and stepped down off a step.  He experienced acute onsets of thoracic spine pain.  He did okay the remainder of the day but by the next morning found that he could barely get out of bed.  Subsequently, he tried a course of higher dose of prednisone which was ineffective at relieving his pain.  His primary care physician then started him on hydrocodone/acetaminophen which she takes twice daily.  This helps with the pain but does not relieve it completely.  It has now been several weeks of persistent pain which does not seem to be improving.  He rates his pain is quite severe and 8 out of 10 on a 10 point scale.  He has some disability related to the symptoms.  He scored 8 out of 24 on the [employer] disability questionnaire.  He denies lower extremity paresthesias, weakness or changes in bowel or bladder function.  His clinical symptoms correspond to the T8, T9 and T10 levels.  Past Medical History:  Diagnosis Date   Coronary artery disease    High cholesterol    Hypertension     Past Surgical History:  Procedure Laterality Date   CORONARY ANGIOPLASTY WITH STENT PLACEMENT     IR RADIOLOGIST EVAL & MGMT  04/30/2022   LUNG TRANSPLANT, DOUBLE     NISSEN FUNDOPLICATION      Allergies: Patient has no known allergies.  Medications: Prior to Admission medications   Medication Sig Start Date End Date Taking? Authorizing Provider  azithromycin (ZITHROMAX) 250 MG tablet Take 250 mg by mouth daily.   Yes [provider]  HYDROcodone-acetaminophen (NORCO/VICODIN) 5-325 MG per tablet Take 1 tablet by mouth every 6 (six) hours as needed for moderate pain or severe pain. 04/19/14  Yes Antonietta Breach, PA-C  metoprolol tartrate (LOPRESSOR) 25 MG tablet Take 25 mg by mouth 2 (two) times daily.   Yes [provider]  predniSONE (DELTASONE) 5 MG tablet Take 5 mg by mouth daily with breakfast.   Yes [provider]  tacrolimus (PROGRAF) 1 MG capsule Take 6 mg by mouth 2 (two) times daily.    Yes [provider]  aspirin 81 MG chewable tablet Chew 81 mg by mouth daily. Patient not taking: Reported on 04/30/2022    [provider]  atorvastatin (LIPITOR) 20 MG tablet Take 20 mg by mouth daily.    [provider]  calcium-vitamin D (OSCAL WITH D) 250-125 MG-UNIT per tablet Take 2 tablets by mouth 2 (two) times daily.    [provider]  ezetimibe (ZETIA) 10 MG tablet Take 10 mg by mouth daily.    [provider]  ferrous sulfate 325 (65 FE) MG tablet Take 325 mg by mouth 2 (two) times daily with a meal.    [provider]  guaiFENesin (MUCINEX) 600 MG 12 hr tablet Take 600 mg by mouth 2 (two) times daily as needed for cough or to loosen phlegm.    [provider]  ibandronate (BONIVA) 150 MG tablet Take 150 mg  by mouth every 30 (thirty) days. Take in the morning with a full glass of water, on an empty stomach, and do not take anything else by mouth or lie down for the next 30 min.    [provider]  latanoprost (XALATAN) 0.005 % ophthalmic solution Place 2 drops into both eyes at bedtime.    [provider]  levofloxacin (LEVAQUIN) 750 MG tablet Take 1 tablet (750 mg total) by mouth daily. Patient not taking: Reported on 06/15/2015 11/23/14   Delsa Grana, PA-C  lidocaine (LIDODERM) 5 % Place 1 patch onto the skin daily. Remove & Discard patch within 12 hours or as directed by MD 08/16/21   Prosperi, Darrick Meigs H, PA-C  lidocaine  (XYLOCAINE) 2 % solution Use as directed 15 mLs in the mouth or throat every 4 (four) hours as needed for mouth pain. 06/08/19   Larene Pickett, PA-C  meloxicam (MOBIC) 15 MG tablet Take 1 tablet (15 mg total) by mouth daily. 08/16/21   Prosperi, Christian H, PA-C  Multiple Vitamin (MULTIVITAMIN WITH MINERALS) TABS tablet Take 1 tablet by mouth daily.    [provider]  Multiple Vitamins-Minerals (PRESERVISION AREDS 2 PO) Take 1 tablet by mouth daily.    [provider]  nitroGLYCERIN (NITROSTAT) 0.4 MG SL tablet Place 0.4 mg under the tongue every 5 (five) minutes as needed for chest pain.    [provider]  sulfamethoxazole-trimethoprim (BACTRIM,SEPTRA) 400-80 MG tablet Take 1 tablet by mouth daily.    [provider]     No family history on file.  Social History   Socioeconomic History   Marital status: Married    Spouse name: Not on file   Number of children: Not on file   Years of education: Not on file   Highest education level: Not on file  Occupational History   Not on file  Tobacco Use   Smoking status: Never    Passive exposure: Never   Smokeless tobacco: Never  Vaping Use   Vaping Use: Never used  Substance and Sexual Activity   Alcohol use: No   Drug use: Never   Sexual activity: Not on file  Other Topics Concern   Not on file  Social History Narrative   Not on file   Social Determinants of Health   Financial Resource Strain: Not on file  Food Insecurity: Not on file  Transportation Needs: Not on file  Physical Activity: Not on file  Stress: Not on file  Social Connections: Not on file    Review of Systems: A 12 point ROS discussed and pertinent positives are indicated in the HPI above.  All other systems are negative.  Review of Systems  Vital Signs: BP (!) 154/88 (BP Location: Left Arm, Patient Position: Sitting, Cuff Size: Normal)   Pulse 70   Temp 98.2 F (36.8 C) (Oral)   Resp 14   Wt 76.2 kg   SpO2 100%    BMI 21.57 kg/m    Physical Exam Constitutional:      General: He is not in acute distress.    Appearance: Normal appearance.  HENT:     Head: Normocephalic and atraumatic.  Eyes:     General: No scleral icterus. Cardiovascular:     Rate and Rhythm: Normal rate.  Pulmonary:     Effort: Pulmonary effort is normal.  Abdominal:     General: Abdomen is flat.     Palpations: Abdomen is soft.  Musculoskeletal:  Back:     Comments: TTP over the T8, T9 and T10 spinous processes.  Skin:    General: Skin is warm and dry.  Neurological:     Mental Status: He is alert and oriented to person, place, and time.  Psychiatric:        Mood and Affect: Mood normal.        Behavior: Behavior normal.       Imaging: IR Radiologist Eval & Mgmt  Result Date: 04/30/2022 EXAM: NEW PATIENT OFFICE VISIT CHIEF COMPLAINT: SEE EPIC NOTE HISTORY OF PRESENT ILLNESS: SEE EPIC NOTE REVIEW OF SYSTEMS: SEE EPIC NOTE PHYSICAL EXAMINATION: SEE EPIC NOTE ASSESSMENT AND PLAN: SEE EPIC NOTE Electronically Signed   By: Malachy Moan M.D.   On: 04/30/2022 12:15   MR THORACIC SPINE W WO CONTRAST  Result Date: 04/24/2022 CLINICAL DATA:  Possible compression fracture. Patient pick something heavy up and felt pain 2 weeks ago. EXAM: MRI THORACIC WITHOUT AND WITH CONTRAST TECHNIQUE: Multiplanar and multiecho pulse sequences of the thoracic spine were obtained without and with intravenous contrast. CONTRAST:  78mL GADAVIST GADOBUTROL 1 MMOL/ML IV SOLN COMPARISON:  CT lumbar spine 07/27/2021, lumbar spine radiographs 09/27/2010 FINDINGS: Alignment: There is exaggerated thoracic kyphosis. There is multilevel stepwise grade 1 anterolisthesis from T2-T3 through T5-T6. Vertebrae: There is T1 hypointensity with associated edema along the T8-T9 disc space with mild loss of vertebral body height more notable at T9, consistent with acute to subacute fractures. There is associated patchy enhancement. There is no bony  retropulsion. There is compression deformity of the T10 vertebral body with up to approximately 50% loss of vertebral body height with minimal edema and enhancement. There is no bony retropulsion. Compression deformities of the L3 and L4 vertebral bodies are seen on the sagittal scout sequence, unchanged since the prior CT from 2023. The other vertebral body heights are preserved. There is no suspicious marrow signal abnormality or other marrow enhancement. Background marrow signal is normal. Cord:  Normal in signal and morphology. Paraspinal and other soft tissues: A left renal cyst is noted requiring no specific imaging follow-up. The paraspinal soft tissues are unremarkable. Disc levels: There is overall mild multilevel degenerative change of the thoracic spine. There is no significant disc herniation. There is no significant spinal canal or neural foraminal stenosis. There is no evidence of cord or nerve root impingement. IMPRESSION: 1. Acute to subacute compression fractures of the T8 and T9 vertebral body with mild loss of vertebral body height and no bony retropulsion. 2. Compression deformity of the T10 vertebral body with up to approximately 50% loss of vertebral body height with minimal edema and enhancement, likely subacute to chronic. No bony retropulsion. 3. Compression deformities of the L3 and L4 vertebral bodies are unchanged since the prior CT from 2023. 4. Overall mild degenerative changes without significant spinal canal or neural foraminal stenosis. No evidence of cord or nerve root impingement. Electronically Signed   By: Lesia Hausen M.D.   On: 04/24/2022 12:03    Labs:  CBC: Recent Labs    12/16/21 1429  WBC 4.6  HGB 12.6*  HCT 36.7*  PLT 112*    COAGS: No results for input(s): "INR", "APTT" in the last 8760 hours.  BMP: Recent Labs    12/16/21 1429  NA 139  K 4.4  CL 100  CO2 28  GLUCOSE 93  BUN 19  CALCIUM 9.3  CREATININE 1.19  GFRNONAA >60    LIVER FUNCTION  TESTS: No results for  input(s): "BILITOT", "AST", "ALT", "ALKPHOS", "PROT", "ALBUMIN" in the last 8760 hours.  TUMOR MARKERS: No results for input(s): "AFPTM", "CEA", "CA199", "CHROMGRNA" in the last 8760 hours.  Assessment & Plan:   Patient has suffered subacute osteoporotic fracture of the T8, T9 and T10 vertebrae.   History and exam have demonstrated the following:  Acute/Subacute fracture by imaging dated 04/24/22, Pain on exam concordant with level of fracture, and Failure of conservative therapy and pain refractory to narcotic pain mediation   ICD-10-CM Codes that Support Medical Necessity ([email or web address])  M80.08XA    Age-related osteoporosis with current pathological fracture, vertebra(e), initial encounter for fracture, S22.060A    Wedge compression fracture of T7-T8 vertebra, initial encounter for closed fracture , and S22.070A    Wedge compression fracture of T9-T10 vertebra, initial encounter for closed fracture    Plan:  T8, T9 and T10 vertebral body augmentation with balloon kyphoplasty  Post-procedure disposition: outpatient [facility] Medication holds: None  The patient has suffered a fracture of the T8, T9 and T10 vertebral bodies. It is recommended that patients aged 64 years or older be evaluated for possible testing or treatment of osteoporosis. A copy of this consult report is sent to the patient's referring physician.  Advanced Care Plan: The patient did not want to provide an [place] at the time of this visit     Total time spent on today's visit was over 40 Minutes including both face-to-face time and non face-to-face time, personally spent on review of chart (including labs and relevant imaging), discussing further workup and treatment options, referral to specialist if needed, reviewing outside records if pertinent, answering patient questions, and coordinating care regarding thoracic spine  pain due to subacute thoracic osteoporotic compression fractures as well as management strategy.    Electronically Signed: Criselda Peaches 04/30/2022, 12:18 PM

## 2022-05-08 ENCOUNTER — Encounter: Payer: Self-pay | Admitting: Internal Medicine

## 2022-05-14 ENCOUNTER — Other Ambulatory Visit: Payer: Self-pay | Admitting: Internal Medicine

## 2022-05-14 ENCOUNTER — Inpatient Hospital Stay: Admission: RE | Admit: 2022-05-14 | Payer: Medicare Other | Source: Ambulatory Visit

## 2022-05-14 ENCOUNTER — Ambulatory Visit
Admission: RE | Admit: 2022-05-14 | Discharge: 2022-05-14 | Disposition: A | Discharge status: Home / Self Care | Payer: Medicare Other | Source: Ambulatory Visit | Attending: Internal Medicine | Admitting: Internal Medicine

## 2022-05-14 DIAGNOSIS — S22070A Wedge compression fracture of T9-T10 vertebra, initial encounter for closed fracture: Secondary | ICD-10-CM

## 2022-05-14 DIAGNOSIS — S22060A Wedge compression fracture of T7-T8 vertebra, initial encounter for closed fracture: Secondary | ICD-10-CM

## 2022-05-14 DIAGNOSIS — M8000XA Age-related osteoporosis with current pathological fracture, unspecified site, initial encounter for fracture: Secondary | ICD-10-CM

## 2022-05-14 HISTORY — PX: IR KYPHO EA ADDL LEVEL THORACIC OR LUMBAR: IMG5520

## 2022-05-14 HISTORY — PX: IR KYPHO THORACIC WITH BONE BIOPSY: IMG5518

## 2022-05-14 MED ORDER — ACETAMINOPHEN 10 MG/ML IV SOLN
1000.0000 mg | Freq: Once | INTRAVENOUS | Status: AC
  Administered 2022-05-14: 1000 mg via INTRAVENOUS

## 2022-05-14 MED ORDER — CEFAZOLIN SODIUM-DEXTROSE 2-4 GM/100ML-% IV SOLN
2.0000 g | INTRAVENOUS | Status: AC
  Administered 2022-05-14: 2 g via INTRAVENOUS

## 2022-05-14 MED ORDER — FENTANYL CITRATE PF 50 MCG/ML IJ SOSY
25.0000 ug | PREFILLED_SYRINGE | INTRAMUSCULAR | Status: DC | PRN
  Administered 2022-05-14 (×2): 25 ug via INTRAVENOUS

## 2022-05-14 MED ORDER — SODIUM CHLORIDE 0.9 % IV SOLN: INTRAVENOUS | Status: DC

## 2022-05-14 MED ORDER — MIDAZOLAM HCL 2 MG/2ML IJ SOLN
1.0000 mg | INTRAMUSCULAR | Status: DC | PRN
  Administered 2022-05-14: 1 mg via INTRAVENOUS
  Administered 2022-05-14: 0.5 mg via INTRAVENOUS
  Administered 2022-05-14: 1 mg via INTRAVENOUS

## 2022-05-14 NOTE — Progress Notes (Signed)
Pt back in nursing recovery area. Pt still drowsy from procedure but will wake up when spoken to. Pt follows commands, talks in complete sentences and has no complaints at this time. Pt will remain in nursing station until discharge.  ?

## 2022-05-14 NOTE — Discharge Instructions (Signed)
Kyphoplasty Post Procedure Discharge Instructions  May resume a regular diet and any medications that you routinely take (including pain medications). However, if you are taking Aspirin or an anticoagulant/blood thinner you will be told when you can resume taking these by the healthcare provider. No driving day of procedure. The day of your procedure take it easy. You may use an ice pack as needed to injection sites on back.  Ice to back 30 minutes on and 30 minutes off, as needed. May remove bandaids tomorrow after taking a shower. Replace daily with a clean bandaid until healed.  Do not lift anything heavier than a milk jug for 1-2 weeks or determined by your physician.  Follow up with your physician in 2 weeks.    Please contact our office at [phone] for the following symptoms or if you have any questions:  Fever greater than 100 degrees Increased swelling, pain, or redness at injection site. Increased back and/or leg pain New numbness or change in symptoms from before the procedure.    Thank you for visiting [facility].

## 2022-05-21 ENCOUNTER — Other Ambulatory Visit: Payer: Self-pay | Admitting: Interventional Radiology

## 2022-05-21 ENCOUNTER — Telehealth: Payer: Self-pay

## 2022-05-21 DIAGNOSIS — S22000A Wedge compression fracture of unspecified thoracic vertebra, initial encounter for closed fracture: Secondary | ICD-10-CM

## 2022-05-21 NOTE — Telephone Encounter (Signed)
Phone call to pt to follow up from her kyphoplasty on 05/14/22. Pt reports her pain is completely gone post procedure but is still having "a little soreness". Pt reports she is able to move around a little better. Pt denies any signs of infection, redness at the site, draining or fever. Pt has no complaints at this time and will be scheduled for a telephone follow up with Dr. Laurence Ferrari next week. Pt advised to call back if anything were to change or any concerns arise and we will arrange an in person appointment. Pt verbalized understanding.

## 2022-05-28 ENCOUNTER — Ambulatory Visit
Admission: RE | Admit: 2022-05-28 | Discharge: 2022-05-28 | Disposition: A | Discharge status: Home / Self Care | Payer: Medicare Other | Source: Ambulatory Visit | Attending: Interventional Radiology | Admitting: Interventional Radiology

## 2022-05-28 DIAGNOSIS — S22000A Wedge compression fracture of unspecified thoracic vertebra, initial encounter for closed fracture: Secondary | ICD-10-CM

## 2022-05-28 HISTORY — PX: IR RADIOLOGIST EVAL & MGMT: IMG5224

## 2022-05-28 NOTE — Progress Notes (Signed)
Chief Complaint: Patient was consulted remotely today (TeleHealth) for multiple thoracic compression fractures at the request of Carletha Dawn K.    Referring Physician(s): Caraline Deutschman K  History of Present Illness: Jacob Erickson is a 67 y.o. male With a history of highly symptomatic T8, T9 and T10 thoracic compression fractures.  He underwent 3 level cement augmentation with balloon kyphoplasty on 05/14/2022.  We spoke over the telephone today to evaluate his recovery.  He is extremely happy on the phone and reports that his pain is 100% resolved.  He had some mild soreness for the first 4 days post procedure but is now doing excellent.  He states that recently at church his pastor told him to slow down because he was walking so quickly.  He could not be more pleased with his clinical outcome and is very grateful.  No new issues or active complaints at this time.  Past Medical History:  Diagnosis Date   Coronary artery disease    High cholesterol    Hypertension     Past Surgical History:  Procedure Laterality Date   CORONARY ANGIOPLASTY WITH STENT PLACEMENT     IR KYPHO EA ADDL LEVEL THORACIC OR LUMBAR  05/14/2022   IR KYPHO EA ADDL LEVEL THORACIC OR LUMBAR  05/14/2022   IR KYPHO THORACIC WITH BONE BIOPSY  05/14/2022   IR RADIOLOGIST EVAL & MGMT  04/30/2022   IR RADIOLOGIST EVAL & MGMT  05/28/2022   LUNG TRANSPLANT, DOUBLE     NISSEN FUNDOPLICATION      Allergies: Patient has no known allergies.  Medications: Prior to Admission medications   Medication Sig Start Date End Date Taking? Authorizing Provider  aspirin 81 MG chewable tablet Chew 81 mg by mouth daily. Patient not taking: Reported on 04/30/2022    [provider]  atorvastatin (LIPITOR) 20 MG tablet Take 20 mg by mouth daily.    [provider]  azithromycin (ZITHROMAX) 250 MG tablet Take 250 mg by mouth daily.    [provider]  calcium-vitamin D (OSCAL WITH D) 250-125  MG-UNIT per tablet Take 2 tablets by mouth 2 (two) times daily.    [provider]  ezetimibe (ZETIA) 10 MG tablet Take 10 mg by mouth daily.    [provider]  ferrous sulfate 325 (65 FE) MG tablet Take 325 mg by mouth 2 (two) times daily with a meal.    [provider]  guaiFENesin (MUCINEX) 600 MG 12 hr tablet Take 600 mg by mouth 2 (two) times daily as needed for cough or to loosen phlegm.    [provider]  HYDROcodone-acetaminophen (NORCO/VICODIN) 5-325 MG per tablet Take 1 tablet by mouth every 6 (six) hours as needed for moderate pain or severe pain. 04/19/14   Antonietta Breach, PA-C  ibandronate (BONIVA) 150 MG tablet Take 150 mg by mouth every 30 (thirty) days. Take in the morning with a full glass of water, on an empty stomach, and do not take anything else by mouth or lie down for the next 30 min.    [provider]  latanoprost (XALATAN) 0.005 % ophthalmic solution Place 2 drops into both eyes at bedtime.    [provider]  levofloxacin (LEVAQUIN) 750 MG tablet Take 1 tablet (750 mg total) by mouth daily. Patient not taking: Reported on 06/15/2015 11/23/14   Delsa Grana, PA-C  lidocaine (LIDODERM) 5 % Place 1 patch onto the skin daily. Remove & Discard patch within 12 hours or as directed  by MD 08/16/21   Prosperi, Darrick Meigs H, PA-C  lidocaine (XYLOCAINE) 2 % solution Use as directed 15 mLs in the mouth or throat every 4 (four) hours as needed for mouth pain. 06/08/19   Larene Pickett, PA-C  meloxicam (MOBIC) 15 MG tablet Take 1 tablet (15 mg total) by mouth daily. 08/16/21   Prosperi, Christian H, PA-C  metoprolol tartrate (LOPRESSOR) 25 MG tablet Take 25 mg by mouth 2 (two) times daily.    [provider]  Multiple Vitamin (MULTIVITAMIN WITH MINERALS) TABS tablet Take 1 tablet by mouth daily.    [provider]  Multiple Vitamins-Minerals (PRESERVISION AREDS 2 PO) Take 1 tablet by mouth daily.    [provider]   nitroGLYCERIN (NITROSTAT) 0.4 MG SL tablet Place 0.4 mg under the tongue every 5 (five) minutes as needed for chest pain.    [provider]  predniSONE (DELTASONE) 5 MG tablet Take 5 mg by mouth daily with breakfast.    [provider]  sulfamethoxazole-trimethoprim (BACTRIM,SEPTRA) 400-80 MG tablet Take 1 tablet by mouth daily.    [provider]  tacrolimus (PROGRAF) 1 MG capsule Take 6 mg by mouth 2 (two) times daily.     [provider]     No family history on file.  Social History   Socioeconomic History   Marital status: Married    Spouse name: Not on file   Number of children: Not on file   Years of education: Not on file   Highest education level: Not on file  Occupational History   Not on file  Tobacco Use   Smoking status: Never    Passive exposure: Never   Smokeless tobacco: Never  Vaping Use   Vaping Use: Never used  Substance and Sexual Activity   Alcohol use: No   Drug use: Never   Sexual activity: Not on file  Other Topics Concern   Not on file  Social History Narrative   Not on file   Social Determinants of Health   Financial Resource Strain: Not on file  Food Insecurity: Not on file  Transportation Needs: Not on file  Physical Activity: Not on file  Stress: Not on file  Social Connections: Not on file   Review of Systems  Review of Systems: A 12 point ROS discussed and pertinent positives are indicated in the HPI above.  All other systems are negative.  Physical Exam No direct physical exam was performed (except for noted visual exam findings with Video Visits).    Vital Signs: There were no vitals taken for this visit.  Imaging: IR Radiologist Eval & Mgmt  Result Date: 05/28/2022 EXAM: ESTABLISHED PATIENT OFFICE VISIT CHIEF COMPLAINT: SEE EPIC NOTE HISTORY OF PRESENT ILLNESS: SEE EPIC NOTE REVIEW OF SYSTEMS: SEE EPIC NOTE PHYSICAL EXAMINATION: SEE EPIC NOTE ASSESSMENT AND PLAN: SEE EPIC NOTE Electronically  Signed   By: Jacqulynn Cadet M.D.   On: 05/28/2022 15:58   IR KYPHO THORACIC WITH BONE BIOPSY  Result Date: 05/14/2022 CLINICAL DATA:  67 year old male with subacute and highly symptomatic osteoporotic compression fractures of T8, T9 and T10. This is his initial encounter and he presents for cement augmentation of all 3 levels with balloon kyphoplasty. M80.08XA Age-related osteoporosis with current pathological fracture, vertebra(e), initial encounter for fracture, S22.060A Wedge compression fracture of T7-T8 vertebra, initial encounter for closed fracture S22.070A Wedge compression fracture of T9-T10 vertebra, initial encounter for closed fracture EXAM: FLUOROSCOPIC GUIDED KYPHOPLASTY OF THE  VERTEBRAL BODY COMPARISON:  MRI of the thoracic spine 04/24/2022 MEDICATIONS: As antibiotic prophylaxis, 2 g Ancef was ordered pre-procedure and administered intravenously within 1 hour of incision. 1000 mg Tylenol was administered intravenously following the procedure. ANESTHESIA/SEDATION: Moderate (conscious) sedation was employed during this procedure. A total of Versed 2.5 mg and Fentanyl 50 mcg was administered intravenously. Moderate Sedation Time: 40 minutes. The patient's level of consciousness and vital signs were monitored continuously by radiology nursing throughout the procedure under my direct supervision. FLUOROSCOPY TIME:  Radiation exposure index: 34.2 mGy reference air kerma COMPLICATIONS: None immediate. PROCEDURE: The procedure, risks (including but not limited to bleeding, infection, organ damage), benefits, and alternatives were explained to the patient. Questions regarding the procedure were encouraged and answered. The patient understands and consents to the procedure. The patient was placed prone on the fluoroscopic table. The skin overlying the upper thoracic region was then prepped and draped in the usual sterile fashion. Maximal barrier sterile technique was utilized including caps, mask,  sterile gowns, sterile gloves, sterile drape, hand hygiene and skin antiseptic. Intravenous Fentanyl and Versed were administered as conscious sedation during continuous cardiorespiratory monitoring by the radiology RN. T10 The left pedicle at T10 was then infiltrated with 1% lidocaine followed by the advancement of a Kyphon trocar needle through the left pedicle into the posterior one-third of the vertebral body. Subsequently, the osteo drill was advanced to the anterior third of the vertebral body. The osteo drill was retracted. Through the working cannula, a Kyphon inflatable bone tamp 15 x 2.5 was advanced and positioned with the distal marker approximately 5 mm from the anterior aspect of the cortex. Appropriate positioning was confirmed on the AP projection. At this time, the balloon was expanded using contrast via a Kyphon inflation syringe device via micro tubing. T9 In similar fashion, the right T9 pedicle was infiltrated with 1% lidocaine followed by the advancement of a second Kyphon trocar needle through the right pedicle into the posterior third of the vertebral body. Subsequently, the osteo drill was coaxially advanced to the anterior right third. The osteo drill was exchanged for a Kyphon inflatable bone tamp 15 x 2.5, advanced to the 5 mm of the anterior aspect of the cortex. Appropriate positioning was confirmed on the AP projection. At this time, the balloon was expanded using contrast via a Kyphon inflation syringe device via micro tubing. T8 The left pedicle at T10 was then infiltrated with 1% lidocaine followed by the advancement of a Kyphon trocar needle through the left pedicle into the posterior one-third of the vertebral body. Subsequently, the osteo drill was advanced to the anterior third of the vertebral body. The osteo drill was retracted. Through the working cannula, a Kyphon inflatable bone tamp 15 x 2.5 was advanced and positioned with the distal marker approximately 5 mm from the  anterior aspect of the cortex. Appropriate positioning was confirmed on the AP projection. At this time, the balloon was expanded using contrast via a Kyphon inflation syringe device via micro tubing. Inflations were continued until there was near apposition with the superior end plate. At this time, methylmethacrylate mixture was reconstituted in the Kyphon bone mixing device system. This was then loaded into the delivery mechanism, attached to Kyphon bone fillers. The balloons were deflated and removed followed by the instillation of methylmethacrylate mixture with excellent filling in the AP and lateral projections. No extravasation was noted in the disk spaces or posteriorly into the spinal canal. No epidural venous contamination was seen. The working cannulae and the bone  filler were then retrieved and removed. Hemostasis was achieved with manual compression. The patient tolerated the procedure well without immediate postprocedural complication. IMPRESSION: 1. Technically successful T10 vertebral body augmentation using balloon kyphoplasty. 2. Technically successful T9 vertebral body augmentation using balloon kyphoplasty. 3. Technically successful T8 vertebral body augmentation using balloon kyphoplasty. 4. Per CMS PQRS reporting requirements (PQRS Measure 24): Given the patient's age of greater than 46 and the fracture site (hip, distal radius, or spine), the patient should be tested for osteoporosis using DXA, and the appropriate treatment considered based on the DXA results. Electronically Signed   By: Jacqulynn Cadet M.D.   On: 05/14/2022 12:09   IR KYPHO EA ADDL LEVEL THORACIC OR LUMBAR  Result Date: 05/14/2022 CLINICAL DATA:  67 year old male with subacute and highly symptomatic osteoporotic compression fractures of T8, T9 and T10. This is his initial encounter and he presents for cement augmentation of all 3 levels with balloon kyphoplasty. M80.08XA Age-related osteoporosis with current  pathological fracture, vertebra(e), initial encounter for fracture, S22.060A Wedge compression fracture of T7-T8 vertebra, initial encounter for closed fracture S22.070A Wedge compression fracture of T9-T10 vertebra, initial encounter for closed fracture EXAM: FLUOROSCOPIC GUIDED KYPHOPLASTY OF THE  VERTEBRAL BODY COMPARISON:  MRI of the thoracic spine 04/24/2022 MEDICATIONS: As antibiotic prophylaxis, 2 g Ancef was ordered pre-procedure and administered intravenously within 1 hour of incision. 1000 mg Tylenol was administered intravenously following the procedure. ANESTHESIA/SEDATION: Moderate (conscious) sedation was employed during this procedure. A total of Versed 2.5 mg and Fentanyl 50 mcg was administered intravenously. Moderate Sedation Time: 40 minutes. The patient's level of consciousness and vital signs were monitored continuously by radiology nursing throughout the procedure under my direct supervision. FLUOROSCOPY TIME:  Radiation exposure index: 34.2 mGy reference air kerma COMPLICATIONS: None immediate. PROCEDURE: The procedure, risks (including but not limited to bleeding, infection, organ damage), benefits, and alternatives were explained to the patient. Questions regarding the procedure were encouraged and answered. The patient understands and consents to the procedure. The patient was placed prone on the fluoroscopic table. The skin overlying the upper thoracic region was then prepped and draped in the usual sterile fashion. Maximal barrier sterile technique was utilized including caps, mask, sterile gowns, sterile gloves, sterile drape, hand hygiene and skin antiseptic. Intravenous Fentanyl and Versed were administered as conscious sedation during continuous cardiorespiratory monitoring by the radiology RN. T10 The left pedicle at T10 was then infiltrated with 1% lidocaine followed by the advancement of a Kyphon trocar needle through the left pedicle into the posterior one-third of the vertebral  body. Subsequently, the osteo drill was advanced to the anterior third of the vertebral body. The osteo drill was retracted. Through the working cannula, a Kyphon inflatable bone tamp 15 x 2.5 was advanced and positioned with the distal marker approximately 5 mm from the anterior aspect of the cortex. Appropriate positioning was confirmed on the AP projection. At this time, the balloon was expanded using contrast via a Kyphon inflation syringe device via micro tubing. T9 In similar fashion, the right T9 pedicle was infiltrated with 1% lidocaine followed by the advancement of a second Kyphon trocar needle through the right pedicle into the posterior third of the vertebral body. Subsequently, the osteo drill was coaxially advanced to the anterior right third. The osteo drill was exchanged for a Kyphon inflatable bone tamp 15 x 2.5, advanced to the 5 mm of the anterior aspect of the cortex. Appropriate positioning was confirmed on the AP projection. At this time, the balloon  was expanded using contrast via a Kyphon inflation syringe device via micro tubing. T8 The left pedicle at T10 was then infiltrated with 1% lidocaine followed by the advancement of a Kyphon trocar needle through the left pedicle into the posterior one-third of the vertebral body. Subsequently, the osteo drill was advanced to the anterior third of the vertebral body. The osteo drill was retracted. Through the working cannula, a Kyphon inflatable bone tamp 15 x 2.5 was advanced and positioned with the distal marker approximately 5 mm from the anterior aspect of the cortex. Appropriate positioning was confirmed on the AP projection. At this time, the balloon was expanded using contrast via a Kyphon inflation syringe device via micro tubing. Inflations were continued until there was near apposition with the superior end plate. At this time, methylmethacrylate mixture was reconstituted in the Kyphon bone mixing device system. This was then loaded into  the delivery mechanism, attached to Kyphon bone fillers. The balloons were deflated and removed followed by the instillation of methylmethacrylate mixture with excellent filling in the AP and lateral projections. No extravasation was noted in the disk spaces or posteriorly into the spinal canal. No epidural venous contamination was seen. The working cannulae and the bone filler were then retrieved and removed. Hemostasis was achieved with manual compression. The patient tolerated the procedure well without immediate postprocedural complication. IMPRESSION: 1. Technically successful T10 vertebral body augmentation using balloon kyphoplasty. 2. Technically successful T9 vertebral body augmentation using balloon kyphoplasty. 3. Technically successful T8 vertebral body augmentation using balloon kyphoplasty. 4. Per CMS PQRS reporting requirements (PQRS Measure 24): Given the patient's age of greater than 73 and the fracture site (hip, distal radius, or spine), the patient should be tested for osteoporosis using DXA, and the appropriate treatment considered based on the DXA results. Electronically Signed   By: Jacqulynn Cadet M.D.   On: 05/14/2022 12:09   IR KYPHO EA ADDL LEVEL THORACIC OR LUMBAR  Result Date: 05/14/2022 CLINICAL DATA:  67 year old male with subacute and highly symptomatic osteoporotic compression fractures of T8, T9 and T10. This is his initial encounter and he presents for cement augmentation of all 3 levels with balloon kyphoplasty. M80.08XA Age-related osteoporosis with current pathological fracture, vertebra(e), initial encounter for fracture, S22.060A Wedge compression fracture of T7-T8 vertebra, initial encounter for closed fracture S22.070A Wedge compression fracture of T9-T10 vertebra, initial encounter for closed fracture EXAM: FLUOROSCOPIC GUIDED KYPHOPLASTY OF THE  VERTEBRAL BODY COMPARISON:  MRI of the thoracic spine 04/24/2022 MEDICATIONS: As antibiotic prophylaxis, 2 g Ancef was  ordered pre-procedure and administered intravenously within 1 hour of incision. 1000 mg Tylenol was administered intravenously following the procedure. ANESTHESIA/SEDATION: Moderate (conscious) sedation was employed during this procedure. A total of Versed 2.5 mg and Fentanyl 50 mcg was administered intravenously. Moderate Sedation Time: 40 minutes. The patient's level of consciousness and vital signs were monitored continuously by radiology nursing throughout the procedure under my direct supervision. FLUOROSCOPY TIME:  Radiation exposure index: 34.2 mGy reference air kerma COMPLICATIONS: None immediate. PROCEDURE: The procedure, risks (including but not limited to bleeding, infection, organ damage), benefits, and alternatives were explained to the patient. Questions regarding the procedure were encouraged and answered. The patient understands and consents to the procedure. The patient was placed prone on the fluoroscopic table. The skin overlying the upper thoracic region was then prepped and draped in the usual sterile fashion. Maximal barrier sterile technique was utilized including caps, mask, sterile gowns, sterile gloves, sterile drape, hand hygiene and skin antiseptic. Intravenous Fentanyl  and Versed were administered as conscious sedation during continuous cardiorespiratory monitoring by the radiology RN. T10 The left pedicle at T10 was then infiltrated with 1% lidocaine followed by the advancement of a Kyphon trocar needle through the left pedicle into the posterior one-third of the vertebral body. Subsequently, the osteo drill was advanced to the anterior third of the vertebral body. The osteo drill was retracted. Through the working cannula, a Kyphon inflatable bone tamp 15 x 2.5 was advanced and positioned with the distal marker approximately 5 mm from the anterior aspect of the cortex. Appropriate positioning was confirmed on the AP projection. At this time, the balloon was expanded using contrast via  a Kyphon inflation syringe device via micro tubing. T9 In similar fashion, the right T9 pedicle was infiltrated with 1% lidocaine followed by the advancement of a second Kyphon trocar needle through the right pedicle into the posterior third of the vertebral body. Subsequently, the osteo drill was coaxially advanced to the anterior right third. The osteo drill was exchanged for a Kyphon inflatable bone tamp 15 x 2.5, advanced to the 5 mm of the anterior aspect of the cortex. Appropriate positioning was confirmed on the AP projection. At this time, the balloon was expanded using contrast via a Kyphon inflation syringe device via micro tubing. T8 The left pedicle at T10 was then infiltrated with 1% lidocaine followed by the advancement of a Kyphon trocar needle through the left pedicle into the posterior one-third of the vertebral body. Subsequently, the osteo drill was advanced to the anterior third of the vertebral body. The osteo drill was retracted. Through the working cannula, a Kyphon inflatable bone tamp 15 x 2.5 was advanced and positioned with the distal marker approximately 5 mm from the anterior aspect of the cortex. Appropriate positioning was confirmed on the AP projection. At this time, the balloon was expanded using contrast via a Kyphon inflation syringe device via micro tubing. Inflations were continued until there was near apposition with the superior end plate. At this time, methylmethacrylate mixture was reconstituted in the Kyphon bone mixing device system. This was then loaded into the delivery mechanism, attached to Kyphon bone fillers. The balloons were deflated and removed followed by the instillation of methylmethacrylate mixture with excellent filling in the AP and lateral projections. No extravasation was noted in the disk spaces or posteriorly into the spinal canal. No epidural venous contamination was seen. The working cannulae and the bone filler were then retrieved and removed.  Hemostasis was achieved with manual compression. The patient tolerated the procedure well without immediate postprocedural complication. IMPRESSION: 1. Technically successful T10 vertebral body augmentation using balloon kyphoplasty. 2. Technically successful T9 vertebral body augmentation using balloon kyphoplasty. 3. Technically successful T8 vertebral body augmentation using balloon kyphoplasty. 4. Per CMS PQRS reporting requirements (PQRS Measure 24): Given the patient's age of greater than 3 and the fracture site (hip, distal radius, or spine), the patient should be tested for osteoporosis using DXA, and the appropriate treatment considered based on the DXA results. Electronically Signed   By: Jacqulynn Cadet M.D.   On: 05/14/2022 12:09   IR Radiologist Eval & Mgmt  Result Date: 04/30/2022 EXAM: NEW PATIENT OFFICE VISIT CHIEF COMPLAINT: SEE EPIC NOTE HISTORY OF PRESENT ILLNESS: SEE EPIC NOTE REVIEW OF SYSTEMS: SEE EPIC NOTE PHYSICAL EXAMINATION: SEE EPIC NOTE ASSESSMENT AND PLAN: SEE EPIC NOTE Electronically Signed   By: Jacqulynn Cadet M.D.   On: 04/30/2022 12:15    Labs:  CBC: Recent Labs  12/16/21 1429  WBC 4.6  HGB 12.6*  HCT 36.7*  PLT 112*    COAGS: No results for input(s): "INR", "APTT" in the last 8760 hours.  BMP: Recent Labs    12/16/21 1429  NA 139  K 4.4  CL 100  CO2 28  GLUCOSE 93  BUN 19  CALCIUM 9.3  CREATININE 1.19  GFRNONAA >60    LIVER FUNCTION TESTS: No results for input(s): "BILITOT", "AST", "ALT", "ALKPHOS", "PROT", "ALBUMIN" in the last 8760 hours.  TUMOR MARKERS: No results for input(s): "AFPTM", "CEA", "CA199", "CHROMGRNA" in the last 8760 hours.  Assessment and Plan:  Very pleasant 67 year old gentleman doing remarkably well 2 weeks status post T8, T9 and T10 cement augmentation with balloon kyphoplasty.  He reports that his symptoms have completely resolved and he is pain-free.  He is extremely pleased with his result.  No further  follow-up scheduled.     Electronically Signed: Criselda Peaches 05/28/2022, 4:06 PM   I spent a total of  10 Minutes in remote  clinical consultation, greater than 50% of which was counseling/coordinating care for T8, T9 and T10 compression fractures.    Visit type: Audio only (telephone). Audio (no video) only due to patient preference. Alternative for in-person consultation at [facility], [address], [place], [place]. This visit type was conducted due to national recommendations for restrictions regarding the COVID-19 Pandemic (e.g. social distancing).  This format is felt to be most appropriate for this patient at this time.  All issues noted in this document were discussed and addressed.

## 2022-08-23 ENCOUNTER — Emergency Department (HOSPITAL_BASED_OUTPATIENT_CLINIC_OR_DEPARTMENT_OTHER): Payer: Medicare Other

## 2022-08-23 ENCOUNTER — Emergency Department (HOSPITAL_BASED_OUTPATIENT_CLINIC_OR_DEPARTMENT_OTHER)
Admission: EM | Admit: 2022-08-23 | Discharge: 2022-08-23 | Disposition: A | Discharge status: Home / Self Care | Payer: Medicare Other | Attending: Emergency Medicine | Admitting: Emergency Medicine

## 2022-08-23 DIAGNOSIS — Z7982 Long term (current) use of aspirin: Secondary | ICD-10-CM | POA: Insufficient documentation

## 2022-08-23 DIAGNOSIS — M542 Cervicalgia: Secondary | ICD-10-CM | POA: Diagnosis present

## 2022-08-23 DIAGNOSIS — Z79899 Other long term (current) drug therapy: Secondary | ICD-10-CM | POA: Diagnosis not present

## 2022-08-23 DIAGNOSIS — M436 Torticollis: Secondary | ICD-10-CM | POA: Insufficient documentation

## 2022-08-23 DIAGNOSIS — I2510 Atherosclerotic heart disease of native coronary artery without angina pectoris: Secondary | ICD-10-CM | POA: Insufficient documentation

## 2022-08-23 DIAGNOSIS — I10 Essential (primary) hypertension: Secondary | ICD-10-CM | POA: Diagnosis not present

## 2022-08-23 LAB — CBC WITH DIFFERENTIAL/PLATELET
Abs Immature Granulocytes: 0.02 10*3/uL (ref 0.00–0.07)
Basophils Absolute: 0 10*3/uL (ref 0.0–0.1)
Basophils Relative: 1 %
Eosinophils Absolute: 0.1 10*3/uL (ref 0.0–0.5)
Eosinophils Relative: 4 %
HCT: 34.9 % — ABNORMAL LOW (ref 39.0–52.0)
Hemoglobin: 12 g/dL — ABNORMAL LOW (ref 13.0–17.0)
Immature Granulocytes: 1 %
Lymphocytes Relative: 35 %
Lymphs Abs: 1.3 10*3/uL (ref 0.7–4.0)
MCH: 35.9 pg — ABNORMAL HIGH (ref 26.0–34.0)
MCHC: 34.4 g/dL (ref 30.0–36.0)
MCV: 104.5 fL — ABNORMAL HIGH (ref 80.0–100.0)
Monocytes Absolute: 0.6 10*3/uL (ref 0.1–1.0)
Monocytes Relative: 16 %
Neutro Abs: 1.6 10*3/uL — ABNORMAL LOW (ref 1.7–7.7)
Neutrophils Relative %: 43 %
Platelets: 155 10*3/uL (ref 150–400)
RBC: 3.34 MIL/uL — ABNORMAL LOW (ref 4.22–5.81)
RDW: 12.4 % (ref 11.5–15.5)
WBC: 3.6 10*3/uL — ABNORMAL LOW (ref 4.0–10.5)
nRBC: 0 % (ref 0.0–0.2)

## 2022-08-23 LAB — BASIC METABOLIC PANEL
Anion gap: 7 (ref 5–15)
BUN: 22 mg/dL (ref 8–23)
CO2: 29 mmol/L (ref 22–32)
Calcium: 9.5 mg/dL (ref 8.9–10.3)
Chloride: 102 mmol/L (ref 98–111)
Creatinine, Ser: 1.11 mg/dL (ref 0.61–1.24)
GFR, Estimated: >60 mL/min (ref >60)
Glucose, Bld: 87 mg/dL (ref 70–99)
Potassium: 4.1 mmol/L (ref 3.5–5.1)
Sodium: 138 mmol/L (ref 135–145)

## 2022-08-23 MED ORDER — DICLOFENAC SODIUM 1 % EX GEL: 2.0000 g | Freq: Four times a day (QID) | CUTANEOUS | 0 refills | Status: DC

## 2022-08-23 MED ORDER — CYCLOBENZAPRINE HCL 10 MG PO TABS: 10.0000 mg | ORAL_TABLET | Freq: Two times a day (BID) | ORAL | 0 refills | Status: DC | PRN

## 2022-08-23 MED ORDER — IOHEXOL 350 MG/ML SOLN
100.0000 mL | Freq: Once | INTRAVENOUS | Status: AC | PRN
  Administered 2022-08-23: 75 mL via INTRAVENOUS

## 2022-08-23 MED ORDER — DIAZEPAM 5 MG/ML IJ SOLN
5.0000 mg | Freq: Once | INTRAMUSCULAR | Status: AC
  Administered 2022-08-23: 5 mg via INTRAVENOUS
  Filled 2022-08-23: qty 2

## 2022-08-23 NOTE — ED Triage Notes (Addendum)
Pt reports feeling of a pinched nerve in neck 2 weeks ago and states that yesterday he turned his head sharply and felt the pain resurge along right side of neck. Pain with movement now. States he tried muscle relaxer and heat pad at home with no relief.

## 2022-08-23 NOTE — Discharge Instructions (Signed)
You have been evaluated for your symptoms.  Your symptoms likely due to torticollis.  Please apply the Voltaren gel to affected area and take muscle relaxant as needed and follow-up closely with your doctor for further care.  Happy early birthday.

## 2022-08-23 NOTE — ED Provider Notes (Signed)
[facility] AT [facility] Provider Note   CSN: [id number] Arrival date & time: 08/23/22  4098     History  Chief Complaint  Patient presents with   Neck Pain    Jacob Erickson is a 67 y.o. male.  The history is provided by the patient and medical records. No language interpreter was used.  Neck Pain    67 year old male significant history of CAD, hypercholesterolemia, hypertension, presenting complaining of neck pain.  Patient reports 2 weeks ago he felt like he had a pinched nerve in the back of his neck.  Pain is primarily more towards the right side which has been waxing waning for which he has been using over-the-counter treatment with some relief however yesterday when he turned his head sharply he felt severe pain to the right side of his neck more intense than usual.  Increasing pain with movement not improved with muscle relaxant and heating pad at home.  No associated fever or chills no lightheadedness or dizziness no chest pain or shortness of breath no arm weakness or new numbness.  He is not on any blood thinner medication.  He has has prior spinal fusion.  Home Medications Prior to Admission medications   Medication Sig Start Date End Date Taking? Authorizing Provider  aspirin 81 MG chewable tablet Chew 81 mg by mouth daily. Patient not taking: Reported on 04/30/2022    [provider]  atorvastatin (LIPITOR) 20 MG tablet Take 20 mg by mouth daily.    [provider]  azithromycin (ZITHROMAX) 250 MG tablet Take 250 mg by mouth daily.    [provider]  calcium-vitamin D (OSCAL WITH D) 250-125 MG-UNIT per tablet Take 2 tablets by mouth 2 (two) times daily.    [provider]  ezetimibe (ZETIA) 10 MG tablet Take 10 mg by mouth daily.    [provider]  ferrous sulfate 325 (65 FE) MG tablet Take 325 mg by mouth 2 (two) times daily with a meal.    [provider]  guaiFENesin  (MUCINEX) 600 MG 12 hr tablet Take 600 mg by mouth 2 (two) times daily as needed for cough or to loosen phlegm.    [provider]  HYDROcodone-acetaminophen (NORCO/VICODIN) 5-325 MG per tablet Take 1 tablet by mouth every 6 (six) hours as needed for moderate pain or severe pain. 04/19/14   Antony Madura, PA-C  ibandronate (BONIVA) 150 MG tablet Take 150 mg by mouth every 30 (thirty) days. Take in the morning with a full glass of water, on an empty stomach, and do not take anything else by mouth or lie down for the next 30 min.    [provider]  latanoprost (XALATAN) 0.005 % ophthalmic solution Place 2 drops into both eyes at bedtime.    [provider]  levofloxacin (LEVAQUIN) 750 MG tablet Take 1 tablet (750 mg total) by mouth daily. Patient not taking: Reported on 06/15/2015 11/23/14   [name], PA-C  lidocaine (LIDODERM) 5 % Place 1 patch onto the skin daily. Remove & Discard patch within 12 hours or as directed by MD 08/16/21   Prosperi, Ephriam Knuckles H, PA-C  lidocaine (XYLOCAINE) 2 % solution Use as directed 15 mLs in the mouth or throat every 4 (four) hours as needed for mouth pain. 06/08/19   Garlon Hatchet, PA-C  meloxicam (MOBIC) 15 MG tablet Take 1 tablet (15 mg total) by mouth daily. 08/16/21   Prosperi, Christian H, PA-C  metoprolol tartrate (  LOPRESSOR) 25 MG tablet Take 25 mg by mouth 2 (two) times daily.    [provider]  Multiple Vitamin (MULTIVITAMIN WITH MINERALS) TABS tablet Take 1 tablet by mouth daily.    [provider]  Multiple Vitamins-Minerals (PRESERVISION AREDS 2 PO) Take 1 tablet by mouth daily.    [provider]  nitroGLYCERIN (NITROSTAT) 0.4 MG SL tablet Place 0.4 mg under the tongue every 5 (five) minutes as needed for chest pain.    [provider]  predniSONE (DELTASONE) 5 MG tablet Take 5 mg by mouth daily with breakfast.    [provider]  sulfamethoxazole-trimethoprim (BACTRIM,SEPTRA) 400-80 MG  tablet Take 1 tablet by mouth daily.    [provider]  tacrolimus (PROGRAF) 1 MG capsule Take 6 mg by mouth 2 (two) times daily.     [provider]      Allergies    Patient has no known allergies.    Review of Systems   Review of Systems  Musculoskeletal:  Positive for neck pain.  All other systems reviewed and are negative.   Physical Exam Updated Vital Signs BP (!) 153/81 (BP Location: Left Arm)   Pulse 70   Temp 98.2 F (36.8 C) (Oral)   Resp 18   SpO2 100%  Physical Exam Vitals and nursing note reviewed.  Constitutional:      General: He is not in acute distress.    Appearance: He is well-developed.  HENT:     Head: Atraumatic.  Eyes:     Conjunctiva/sclera: Conjunctivae normal.  Neck:     Vascular: No carotid bruit.  Cardiovascular:     Rate and Rhythm: Normal rate and regular rhythm.  Pulmonary:     Effort: Pulmonary effort is normal.     Breath sounds: Normal breath sounds.  Abdominal:     Palpations: Abdomen is soft.  Musculoskeletal:     Cervical back: Normal range of motion and neck supple. Tenderness (Point tenderness noted to right anterolateral neck at the trapezius with full range of motion and in no skin changes no carotid bruit no pulsatile mass no significant cervical midline spine tenderness) present. No rigidity.     Comments: 5 out of 5 strength upper extremity bilaterally with intact radial pulse and normal grip strength.  Skin:    Findings: No rash.  Neurological:     Mental Status: He is alert. Mental status is at baseline.     ED Results / Procedures / Treatments   Labs (all labs ordered are listed, but only abnormal results are displayed) Labs Reviewed  CBC WITH DIFFERENTIAL/PLATELET - Abnormal; Notable for the following components:      Result Value   WBC 3.6 (*)    RBC 3.34 (*)    Hemoglobin 12.0 (*)    HCT 34.9 (*)    MCV 104.5 (*)    MCH 35.9 (*)    Neutro Abs 1.6 (*)    All other components within normal  limits  BASIC METABOLIC PANEL  CBC WITH DIFFERENTIAL/PLATELET    EKG None  Radiology CT ANGIO HEAD NECK W WO CM  Result Date: 08/23/2022 CLINICAL DATA:  Neck trauma. Arterial injury suspected. Patient reports recurrent nerve pain turning his head sharply yesterday. The patient has pain with movement. EXAM: CT ANGIOGRAPHY HEAD AND NECK WITH AND WITHOUT CONTRAST TECHNIQUE: Multidetector CT imaging of the head and neck was performed using the standard protocol during bolus administration of intravenous contrast. Multiplanar CT image reconstructions and MIPs  were obtained to evaluate the vascular anatomy. Carotid stenosis measurements (when applicable) are obtained utilizing NASCET criteria, using the distal internal carotid diameter as the denominator. RADIATION DOSE REDUCTION: This exam was performed according to the departmental dose-optimization program which includes automated exposure control, adjustment of the mA and/or kV according to patient size and/or use of iterative reconstruction technique. CONTRAST:  75mL OMNIPAQUE IOHEXOL 350 MG/ML SOLN COMPARISON:  CT head without contrast 01/14/2021 FINDINGS: CT HEAD FINDINGS Brain: No acute infarct, hemorrhage, or mass lesion is present. Moderate diffuse white matter hypoattenuation is similar to the prior exams. Deep brain nuclei are within normal limits. No acute or focal cortical abnormality is present. The ventricles are of normal size. No significant extraaxial fluid collection is present. The brainstem and cerebellum are within normal limits. Midline structures are within normal limits. Vascular: No hyperdense vessel or unexpected calcification. Skull: Calvarium is intact. No focal lytic or blastic lesions are present. Sinuses/Orbits: The paranasal sinuses and mastoid air cells are clear. Right lens replacement is noted. Globes and orbits are otherwise normal. Review of the MIP images confirms the above findings CTA NECK FINDINGS Aortic arch: 3 vessel  arch configuration is present. Atherosclerotic changes are present in the aortic arch without significant stenosis or aneurysm. Right carotid system: The right common carotid artery is within normal limits. Atherosclerotic changes are present bifurcation proximal right ICA without significant stenosis. The cervical right ICA is otherwise normal. Left carotid system: The left common carotid artery is within normal limits. Atherosclerotic changes are present at the carotid bifurcation proximal left ICA without significant stenosis. Cervical left ICA is otherwise normal. Vertebral arteries: The left vertebral artery is dominant. Both vertebral arteries originate from the subclavian arteries without significant stenosis. No significant stenosis is present in either vertebral artery in the neck. Skeleton: Vertebral body heights and alignment are normal. Mild exaggeration of the upper thoracic kyphosis in cervical lordosis noted. Mild rightward curvature is present in the mid cervical spine. No focal osseous lesions are present. Other neck: Soft tissues the neck are otherwise unremarkable. Salivary glands are within normal limits. Thyroid is normal. No significant adenopathy is present. No focal mucosal or submucosal lesions are present. Upper chest: The lung apices are clear. Thoracic inlet is within normal limits. Review of the MIP images confirms the above findings CTA HEAD FINDINGS Anterior circulation: Atherosclerotic calcifications are present within the cavernous internal carotid arteries without a significant stenosis through the ICA termini. 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 There may be a small vessel adjacent suggesting this is an infundibulum. The A1 and M1 segments are normal. The anterior communicating artery is patent. MCA bifurcations are within normal limits bilaterally. The ACA and MCA branch vessels are normal bilaterally. Posterior circulation: Left vertebral  artery is the dominant vessel. PICA origins are visualized and normal. Vertebrobasilar junction and basilar artery normal. Posterior cerebral arteries originate basilar tip. Superior cerebellar arteries are patent. The PCA branch vessels are within normal limits bilaterally. Venous sinuses: The dural sinuses are patent. The straight sinus and deep cerebral veins are intact. Cortical veins are within normal limits. No significant vascular malformation is evident. Anatomic variants: None Review of the MIP images confirms the above findings IMPRESSION: 1. No acute vascular injury to the neck. 2. Atherosclerotic changes at the carotid bifurcations and cavernous internal carotid arteries without significant stenosis. 3. 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 4. No significant proximal stenosis, aneurysm, or branch vessel occlusion within the Circle of Willis.  5. Stable moderate diffuse white matter hypoattenuation, likely reflecting the sequela of chronic microvascular ischemia. 6. Degenerative changes in the upper cervical spine uncovertebral spurring leading to foraminal stenosis may account for the patient's nerve pain. 7.  Aortic Atherosclerosis (ICD10-I70.0). Electronically Signed   By: Marin Roberts M.D.   On: 08/23/2022 14:50    Procedures Procedures    Medications Ordered in ED Medications  diazepam (VALIUM) injection 5 mg (5 mg Intravenous Given 08/23/22 1227)  iohexol (OMNIPAQUE) 350 MG/ML injection 100 mL (75 mLs Intravenous Contrast Given 08/23/22 1342)    ED Course/ Medical Decision Making/ A&P                             Medical Decision Making Amount and/or Complexity of Data Reviewed Labs: ordered. Radiology: ordered.  Risk Prescription drug management.   BP (!) 153/81 (BP Location: Left Arm)   Pulse 70   Temp 98.2 F (36.8 C) (Oral)   Resp 18   SpO2 100%   3:62 AM  67 year old male significant history of CAD, hypercholesterolemia, hypertension, presenting  complaining of neck pain.  Patient reports 2 weeks ago he felt like he had a pinched nerve in the back of his neck.  Pain is primarily more towards the right side which has been waxing waning for which he has been using over-the-counter treatment with some relief however yesterday when he turned his head sharply he felt severe pain to the right side of his neck more intense than usual.  Increasing pain with movement not improved with muscle relaxant and heating pad at home.  No associated fever or chills no lightheadedness or dizziness no chest pain or shortness of breath no arm weakness or new numbness.  He is not on any blood thinner medication.  He has has prior spinal fusion.  On exam this is a well-appearing elderly male resting comfortably in bed appears to be in no acute discomfort.  Exam remarkable for point tenderness noted to the lateral base of his neck at the right trapezius muscle.  No pulsatile mass or overlying skin changes and no carotid bruit.  Range of motion is intact.  Suspect muscle strain and likely torticollis however given age, and worsening of his symptoms, will obtain head and neck CTA to rule out vessel dissection.  Will provide supportive care.  -Labs ordered, independently viewed and interpreted by me.  Labs remarkable for overall reassuring.  -The patient was maintained on a cardiac monitor.  I personally viewed and interpreted the cardiac monitored which showed an underlying rhythm of: NSR -Imaging independently viewed and interpreted by me and I agree with radiologist's interpretation.  Result remarkable for CT angio head/neck showing no acute vascular injury.  Pt does have degenerative changes in the upper cervical spine  -This patient presents to the ED for concern of neck pain, this involves an extensive number of treatment options, and is a complaint that carries with it a high risk of complications and morbidity.  The differential diagnosis includes torticollis,  vertebral dissection, muscle strain, radicular pain, cellulitis -Co morbidities that complicate the patient evaluation includes HTN, HLD, CAD -Treatment includes valium -Reevaluation of the patient after these medicines showed that the patient improved -PCP office notes or outside notes reviewed -Discussion with specialist attending Dr. Anitra Lauth -Escalation to admission/observation considered: patients feels much better, is comfortable with discharge, and will follow up with PCP -Prescription medication considered, patient comfortable with flexeril, voltaren gel -Social  Determinant of Health considered   3:08 PM Finding of the CT scan shows degenerative change of the upper cervical spine and uncovertebral spurring leading to foraminal stenosis which may account for patient's nerve pain.  No other concerning findings were noted.  I did discuss this with patient.  No evidence of dissection.  Will discharge home with supportive care and outpatient follow-up.  Return precaution given.  Patient reported feeling better after treatment.          Final Clinical Impression(s) / ED Diagnoses Final diagnoses:  Torticollis, acute    Rx / DC Orders ED Discharge Orders          Ordered    cyclobenzaprine (FLEXERIL) 10 MG tablet  2 times daily PRN        08/23/22 1513    diclofenac Sodium (VOLTAREN ARTHRITIS PAIN) 1 % GEL  4 times daily        08/23/22 1513              Fayrene Helper, PA-C 08/23/22 1514    Gwyneth Sprout, MD 08/26/22 [phone]

## 2023-05-26 ENCOUNTER — Emergency Department (HOSPITAL_COMMUNITY)

## 2023-05-26 ENCOUNTER — Emergency Department (HOSPITAL_COMMUNITY)
Admission: EM | Admit: 2023-05-26 | Discharge: 2023-05-26 | Disposition: A | Discharge status: Home / Self Care | Attending: Emergency Medicine | Admitting: Emergency Medicine

## 2023-05-26 ENCOUNTER — Other Ambulatory Visit: Payer: Self-pay

## 2023-05-26 DIAGNOSIS — R109 Unspecified abdominal pain: Secondary | ICD-10-CM | POA: Diagnosis present

## 2023-05-26 DIAGNOSIS — R1084 Generalized abdominal pain: Secondary | ICD-10-CM | POA: Diagnosis not present

## 2023-05-26 DIAGNOSIS — R110 Nausea: Secondary | ICD-10-CM | POA: Insufficient documentation

## 2023-05-26 DIAGNOSIS — Z85038 Personal history of other malignant neoplasm of large intestine: Secondary | ICD-10-CM | POA: Insufficient documentation

## 2023-05-26 DIAGNOSIS — R197 Diarrhea, unspecified: Secondary | ICD-10-CM | POA: Diagnosis not present

## 2023-05-26 DIAGNOSIS — N281 Cyst of kidney, acquired: Secondary | ICD-10-CM | POA: Diagnosis not present

## 2023-05-26 DIAGNOSIS — C19 Malignant neoplasm of rectosigmoid junction: Secondary | ICD-10-CM | POA: Diagnosis not present

## 2023-05-26 LAB — COMPREHENSIVE METABOLIC PANEL
ALT: 24 U/L (ref 0–44)
AST: 24 U/L (ref 15–41)
Albumin: 3.8 g/dL (ref 3.5–5.0)
Alkaline Phosphatase: 53 U/L (ref 38–126)
Anion gap: 12 (ref 5–15)
BUN: 23 mg/dL (ref 8–23)
CO2: 22 mmol/L (ref 22–32)
Calcium: 9.1 mg/dL (ref 8.9–10.3)
Chloride: 104 mmol/L (ref 98–111)
Creatinine, Ser: 1.23 mg/dL (ref 0.61–1.24)
GFR, Estimated: >60 mL/min (ref >60)
Glucose, Bld: 103 mg/dL — ABNORMAL HIGH (ref 70–99)
Potassium: 3.7 mmol/L (ref 3.5–5.1)
Sodium: 138 mmol/L (ref 135–145)
Total Bilirubin: 0.9 mg/dL (ref 0.0–1.2)
Total Protein: 7.8 g/dL (ref 6.5–8.1)

## 2023-05-26 LAB — CBC
HCT: 39.4 % (ref 39.0–52.0)
Hemoglobin: 13 g/dL (ref 13.0–17.0)
MCH: 36.1 pg — ABNORMAL HIGH (ref 26.0–34.0)
MCHC: 33 g/dL (ref 30.0–36.0)
MCV: 109.4 fL — ABNORMAL HIGH (ref 80.0–100.0)
Platelets: 143 10*3/uL — ABNORMAL LOW (ref 150–400)
RBC: 3.6 MIL/uL — ABNORMAL LOW (ref 4.22–5.81)
RDW: 13 % (ref 11.5–15.5)
WBC: 4 10*3/uL (ref 4.0–10.5)
nRBC: 0 % (ref 0.0–0.2)

## 2023-05-26 LAB — URINALYSIS, ROUTINE W REFLEX MICROSCOPIC
Bilirubin Urine: NEGATIVE
Glucose, UA: NEGATIVE mg/dL
Hgb urine dipstick: NEGATIVE
Ketones, ur: 5 mg/dL — AB
Leukocytes,Ua: NEGATIVE
Nitrite: NEGATIVE
Protein, ur: NEGATIVE mg/dL
Specific Gravity, Urine: >1.046 — ABNORMAL HIGH (ref 1.005–1.030)
pH: 5 (ref 5.0–8.0)

## 2023-05-26 LAB — C DIFFICILE QUICK SCREEN W PCR REFLEX
C Diff antigen: NEGATIVE
C Diff interpretation: NOT DETECTED
C Diff toxin: NEGATIVE

## 2023-05-26 LAB — LIPASE, BLOOD: Lipase: 30 U/L (ref 11–51)

## 2023-05-26 MED ORDER — ONDANSETRON HCL 4 MG/2ML IJ SOLN
4.0000 mg | Freq: Once | INTRAMUSCULAR | Status: AC
  Administered 2023-05-26: 4 mg via INTRAVENOUS
  Filled 2023-05-26: qty 2

## 2023-05-26 MED ORDER — LACTATED RINGERS IV BOLUS
1000.0000 mL | Freq: Once | INTRAVENOUS | Status: AC
  Administered 2023-05-26: 1000 mL via INTRAVENOUS

## 2023-05-26 MED ORDER — ALUM & MAG HYDROXIDE-SIMETH 200-200-20 MG/5ML PO SUSP
30.0000 mL | Freq: Once | ORAL | Status: AC
  Administered 2023-05-26: 30 mL via ORAL
  Filled 2023-05-26: qty 30

## 2023-05-26 MED ORDER — IOHEXOL 300 MG/ML  SOLN
100.0000 mL | Freq: Once | INTRAMUSCULAR | Status: AC | PRN
  Administered 2023-05-26: 100 mL via INTRAVENOUS

## 2023-05-26 NOTE — ED Provider Notes (Signed)
 [facility] EMERGENCY DEPARTMENT AT [facility] Provider Note   CSN: [id number] Arrival date & time: 05/26/23  0335     History Chief Complaint  Patient presents with   Abdominal Pain    HPI Jacob Erickson is a 68 y.o. male presenting for chief complaint of abdominal pain. Is a 68 year old male with colorectal cancer.  He follows with [facility] in the outpatient setting. He was told he had to start eating more food from his recent visit with his oncologist.  Burgess Estelle he had a leftover pulled pork sandwich and immediately started having nausea.  States that he has had a hiatal hernia repair and can no longer vomit.  He states he got severe nausea and cramping and he has had approximately 20 episodes of diarrhea over the last 10 hours. Denies any fevers chills, chest pain, shortness of breath or other symptoms.   Patient's recorded medical, surgical, social, medication list and allergies were reviewed in the Snapshot window as part of the initial history.   Review of Systems   Review of Systems  Constitutional:  Negative for chills and fever.  HENT:  Negative for ear pain and sore throat.   Eyes:  Negative for pain and visual disturbance.  Respiratory:  Negative for cough and shortness of breath.   Cardiovascular:  Negative for chest pain and palpitations.  Gastrointestinal:  Positive for abdominal distention, abdominal pain and diarrhea. Negative for vomiting.  Genitourinary:  Negative for dysuria and hematuria.  Musculoskeletal:  Negative for arthralgias and back pain.  Skin:  Negative for color change and rash.  Neurological:  Negative for seizures and syncope.  All other systems reviewed and are negative.   Physical Exam Updated Vital Signs BP 135/78 (BP Location: Left Arm)   Pulse 94   Temp 98.1 F (36.7 C) (Oral)   Resp 18   SpO2 99%  Physical Exam Vitals and nursing note reviewed.  Constitutional:      General: He is not in acute distress.     Appearance: He is well-developed.  HENT:     Head: Normocephalic and atraumatic.  Eyes:     Conjunctiva/sclera: Conjunctivae normal.  Cardiovascular:     Rate and Rhythm: Normal rate and regular rhythm.     Heart sounds: No murmur heard. Pulmonary:     Effort: Pulmonary effort is normal. No respiratory distress.     Breath sounds: Normal breath sounds.  Abdominal:     Palpations: Abdomen is soft.     Tenderness: There is abdominal tenderness. There is no right CVA tenderness or guarding.  Musculoskeletal:        General: No swelling.     Cervical back: Neck supple.  Skin:    General: Skin is warm and dry.     Capillary Refill: Capillary refill takes less than 2 seconds.  Neurological:     Mental Status: He is alert.  Psychiatric:        Mood and Affect: Mood normal.      ED Course/ Medical Decision Making/ A&P Clinical Course as of 05/26/23 1557  Wed May 26, 2023  1542 Not impacted. Large BM after CT. [CC]    Clinical Course User Index [CC] Glyn Ade, MD    Procedures Procedures   Medications Ordered in ED Medications  lactated ringers bolus 1,000 mL (1,000 mLs Intravenous New Bag/Given 05/26/23 1132)  alum & mag hydroxide-simeth (MAALOX/MYLANTA) 200-200-20 MG/5ML suspension 30 mL (30 mLs Oral Given 05/26/23 1218)  ondansetron (ZOFRAN) injection  4 mg (4 mg Intravenous Given 05/26/23 1133)  iohexol (OMNIPAQUE) 300 MG/ML solution 100 mL (100 mLs Intravenous Contrast Given 05/26/23 1202)   Medical Decision Making:   Jacob Erickson is a 68 y.o. male who presented to the ED today with abdominal pain, detailed above.    Additional history discussed with patient's family/caregivers.  Patient placed on continuous vitals and telemetry monitoring while in ED which was reviewed periodically.  Complete initial physical exam performed, notably the patient  was HDS in NAD.     Reviewed and confirmed nursing documentation for past medical history, family history, social  history.    Initial Assessment:   With the patient's presentation of abdominal pain, most likely diagnosis is nonspecific enteritis. Other diagnoses were considered including (but not limited to) gastroenteritis, colitis, small bowel obstruction, appendicitis, cholecystitis, pancreatitis, nephrolithiasis, UTI, pyleonephritis These are considered less likely due to history of present illness and physical exam findings.   This is most consistent with an acute life/limb threatening illness complicated by underlying chronic conditions.   Initial Plan:  CBC/CMP to evaluate for underlying infectious/metabolic etiology for patient's abdominal pain  Lipase to evaluate for pancreatitis  EKG to evaluate for cardiac source of pain  CTAB/Pelvis with contrast to evaluate for structural/surgical etiology of patients' severe abdominal pain.  Urinalysis and repeat physical assessment to evaluate for UTI/Pyelonpehritis  Empiric management of symptoms with escalating pain control and antiemetics as needed.   Initial Study Results:   Laboratory  All laboratory results reviewed without evidence of clinically relevant pathology.    EKG EKG was reviewed independently. Rate, rhythm, axis, intervals all examined and without medically relevant abnormality. ST segments without concerns for elevations.    Radiology All images reviewed independently. Agree with radiology report at this time.   CT ABDOMEN PELVIS W CONTRAST Result Date: 05/26/2023 CLINICAL DATA:  Acute generalized abdominal pain. EXAM: CT ABDOMEN AND PELVIS WITH CONTRAST TECHNIQUE: Multidetector CT imaging of the abdomen and pelvis was performed using the standard protocol following bolus administration of intravenous contrast. RADIATION DOSE REDUCTION: This exam was performed according to the departmental dose-optimization program which includes automated exposure control, adjustment of the mA and/or kV according to patient size and/or use of iterative  reconstruction technique. CONTRAST:  OMNIPAQUE IOHEXOL 300 MG/ML  SOLN COMPARISON:  None Available. FINDINGS: Lower chest: No acute abnormality. Hepatobiliary: No focal liver abnormality is seen. No gallstones, gallbladder wall thickening, or biliary dilatation. Pancreas: Unremarkable. No pancreatic ductal dilatation or surrounding inflammatory changes. Spleen: Normal in size without focal abnormality. Adrenals/Urinary Tract: Adrenal glands appear normal. Left renal cyst is noted for which no further follow-up is required. No hydronephrosis or renal obstruction is noted. Urinary bladder is unremarkable. Stomach/Bowel: Stomach is unremarkable. No small bowel dilatation is noted. Mildly dilated and fluid-filled sigmoid colon and rectum is noted. Appendix is not clearly visualized. Vascular/Lymphatic: No significant vascular findings are present. No enlarged abdominal or pelvic lymph nodes. Reproductive: Prostate is unremarkable. Other: No ascites or hernia is noted. Musculoskeletal: No acute or significant osseous findings. IMPRESSION: Mildly dilated and fluid-filled sigmoid colon and rectum is noted concerning for possible impaction. No other definite abnormality seen in the abdomen or pelvis. Electronically Signed   By: Lupita Raider M.D.   On: 05/26/2023 15:31   Final Reassessment and Plan:   Patient's history of present on the physicals and findings are most consistent with nonspecific enteritis.  He had a large bowel movement after the CT.  Unlikely to be any  impaction based on that. Stool testing sent off to lab for further diagnostic care and management.  He is ambulatory tolerating p.o. intake and is requesting discharge given symptomatic resolution.  He stated he would follow-up in the outpatient setting with his transplant team.  Strict return precautions reinforced and patient expressed understanding.  Given resolution of symptoms I believe he can be safely managed in the outpatient  setting.  Disposition:  I have considered need for hospitalization, however, considering all of the above, I believe this patient is stable for discharge at this time.  Patient/family educated about specific return precautions for given chief complaint and symptoms.  Patient/family educated about follow-up with PCP .     Patient/family expressed understanding of return precautions and need for follow-up. Patient spoken to regarding all imaging and laboratory results and appropriate follow up for these results. All education provided in verbal form with additional information in written form. Time was allowed for answering of patient questions. Patient discharged.    Emergency Department Medication Summary:   Medications  lactated ringers bolus 1,000 mL (1,000 mLs Intravenous New Bag/Given 05/26/23 1132)  alum & mag hydroxide-simeth (MAALOX/MYLANTA) 200-200-20 MG/5ML suspension 30 mL (30 mLs Oral Given 05/26/23 1218)  ondansetron (ZOFRAN) injection 4 mg (4 mg Intravenous Given 05/26/23 1133)  iohexol (OMNIPAQUE) 300 MG/ML solution 100 mL (100 mLs Intravenous Contrast Given 05/26/23 1202)    Clinical Impression:  1. Generalized abdominal pain      Discharge   Final Clinical Impression(s) / ED Diagnoses Final diagnoses:  Generalized abdominal pain    Rx / DC Orders ED Discharge Orders     None         Glyn Ade, MD 05/26/23 1557

## 2023-05-26 NOTE — ED Triage Notes (Addendum)
 Patient BIB EMS from home c/o abdominal pain started yesterday. Patient report worsening abdominal pain , nausea and diarrhea this morning. Patient report loose stool x7 this morninng. Zofran 4mg  IV given by EMS. Patient denies fever.

## 2023-05-27 LAB — GASTROINTESTINAL PANEL BY PCR, STOOL (REPLACES STOOL CULTURE)

## 2023-06-15 ENCOUNTER — Encounter (HOSPITAL_COMMUNITY): Payer: Self-pay | Admitting: Emergency Medicine

## 2023-06-15 ENCOUNTER — Inpatient Hospital Stay (HOSPITAL_COMMUNITY)
Admission: EM | Admit: 2023-06-15 | Discharge: 2023-06-17 | DRG: 372 | Disposition: A | Discharge status: Home / Self Care | Attending: Internal Medicine | Admitting: Internal Medicine

## 2023-06-15 ENCOUNTER — Other Ambulatory Visit: Payer: Self-pay

## 2023-06-15 DIAGNOSIS — Z955 Presence of coronary angioplasty implant and graft: Secondary | ICD-10-CM

## 2023-06-15 DIAGNOSIS — A0472 Enterocolitis due to Clostridium difficile, not specified as recurrent: Secondary | ICD-10-CM | POA: Diagnosis not present

## 2023-06-15 DIAGNOSIS — Z942 Lung transplant status: Secondary | ICD-10-CM

## 2023-06-15 DIAGNOSIS — K219 Gastro-esophageal reflux disease without esophagitis: Secondary | ICD-10-CM | POA: Diagnosis present

## 2023-06-15 DIAGNOSIS — I959 Hypotension, unspecified: Secondary | ICD-10-CM | POA: Diagnosis present

## 2023-06-15 DIAGNOSIS — Z87898 Personal history of other specified conditions: Secondary | ICD-10-CM

## 2023-06-15 DIAGNOSIS — N179 Acute kidney failure, unspecified: Principal | ICD-10-CM | POA: Diagnosis present

## 2023-06-15 DIAGNOSIS — E7800 Pure hypercholesterolemia, unspecified: Secondary | ICD-10-CM | POA: Diagnosis present

## 2023-06-15 DIAGNOSIS — R110 Nausea: Secondary | ICD-10-CM | POA: Diagnosis present

## 2023-06-15 DIAGNOSIS — N400 Enlarged prostate without lower urinary tract symptoms: Secondary | ICD-10-CM | POA: Diagnosis present

## 2023-06-15 DIAGNOSIS — Z79899 Other long term (current) drug therapy: Secondary | ICD-10-CM

## 2023-06-15 DIAGNOSIS — I10 Essential (primary) hypertension: Secondary | ICD-10-CM | POA: Diagnosis present

## 2023-06-15 DIAGNOSIS — I2510 Atherosclerotic heart disease of native coronary artery without angina pectoris: Secondary | ICD-10-CM | POA: Diagnosis present

## 2023-06-15 DIAGNOSIS — Z791 Long term (current) use of non-steroidal anti-inflammatories (NSAID): Secondary | ICD-10-CM

## 2023-06-15 DIAGNOSIS — E860 Dehydration: Secondary | ICD-10-CM | POA: Diagnosis present

## 2023-06-15 DIAGNOSIS — D869 Sarcoidosis, unspecified: Secondary | ICD-10-CM | POA: Diagnosis present

## 2023-06-15 DIAGNOSIS — E785 Hyperlipidemia, unspecified: Secondary | ICD-10-CM | POA: Diagnosis present

## 2023-06-15 DIAGNOSIS — R197 Diarrhea, unspecified: Secondary | ICD-10-CM | POA: Diagnosis present

## 2023-06-15 NOTE — ED Triage Notes (Signed)
 BIB [facility] for abd pain with nausea, no vomiting r/t procedure done to stomach to prevent vomiting. Pt was hypotensive upon EMS arrival. Approx 330 mL given of LR and 4 mg Zofran given. Pt has a Hx of bilat lung transplant. Taking anti-rejection meds as directed, has had decreased appetite and poor PO intake.

## 2023-06-15 NOTE — ED Notes (Addendum)
 Pt connected to monitor and has a room air saturation of 84-87%. Pt placed on 2 LPM via [place]. PT is also c/o significant abd pain and nausea.  2354 pt up to 94% on Room air, no O2 placed at this time, will cont to monitor.

## 2023-06-16 ENCOUNTER — Emergency Department (HOSPITAL_COMMUNITY)

## 2023-06-16 ENCOUNTER — Other Ambulatory Visit (HOSPITAL_COMMUNITY): Payer: Self-pay

## 2023-06-16 DIAGNOSIS — N179 Acute kidney failure, unspecified: Secondary | ICD-10-CM | POA: Diagnosis present

## 2023-06-16 DIAGNOSIS — Z942 Lung transplant status: Secondary | ICD-10-CM

## 2023-06-16 DIAGNOSIS — Z955 Presence of coronary angioplasty implant and graft: Secondary | ICD-10-CM | POA: Diagnosis not present

## 2023-06-16 DIAGNOSIS — E785 Hyperlipidemia, unspecified: Secondary | ICD-10-CM | POA: Diagnosis present

## 2023-06-16 DIAGNOSIS — I959 Hypotension, unspecified: Secondary | ICD-10-CM | POA: Diagnosis present

## 2023-06-16 DIAGNOSIS — K219 Gastro-esophageal reflux disease without esophagitis: Secondary | ICD-10-CM | POA: Diagnosis present

## 2023-06-16 DIAGNOSIS — I10 Essential (primary) hypertension: Secondary | ICD-10-CM | POA: Diagnosis present

## 2023-06-16 DIAGNOSIS — N400 Enlarged prostate without lower urinary tract symptoms: Secondary | ICD-10-CM | POA: Diagnosis present

## 2023-06-16 DIAGNOSIS — E860 Dehydration: Secondary | ICD-10-CM | POA: Diagnosis present

## 2023-06-16 DIAGNOSIS — A0472 Enterocolitis due to Clostridium difficile, not specified as recurrent: Secondary | ICD-10-CM | POA: Diagnosis present

## 2023-06-16 DIAGNOSIS — Z79899 Other long term (current) drug therapy: Secondary | ICD-10-CM | POA: Diagnosis not present

## 2023-06-16 DIAGNOSIS — R110 Nausea: Secondary | ICD-10-CM | POA: Diagnosis not present

## 2023-06-16 DIAGNOSIS — K2100 Gastro-esophageal reflux disease with esophagitis, without bleeding: Secondary | ICD-10-CM

## 2023-06-16 DIAGNOSIS — A09 Infectious gastroenteritis and colitis, unspecified: Secondary | ICD-10-CM

## 2023-06-16 DIAGNOSIS — Z791 Long term (current) use of non-steroidal anti-inflammatories (NSAID): Secondary | ICD-10-CM | POA: Diagnosis not present

## 2023-06-16 DIAGNOSIS — I2510 Atherosclerotic heart disease of native coronary artery without angina pectoris: Secondary | ICD-10-CM | POA: Diagnosis present

## 2023-06-16 DIAGNOSIS — E7800 Pure hypercholesterolemia, unspecified: Secondary | ICD-10-CM | POA: Diagnosis present

## 2023-06-16 DIAGNOSIS — R197 Diarrhea, unspecified: Secondary | ICD-10-CM | POA: Diagnosis present

## 2023-06-16 DIAGNOSIS — D869 Sarcoidosis, unspecified: Secondary | ICD-10-CM | POA: Diagnosis present

## 2023-06-16 DIAGNOSIS — Z87898 Personal history of other specified conditions: Secondary | ICD-10-CM | POA: Diagnosis not present

## 2023-06-16 LAB — GASTROINTESTINAL PANEL BY PCR, STOOL (REPLACES STOOL CULTURE)

## 2023-06-16 LAB — CBC
HCT: 36.6 % — ABNORMAL LOW (ref 39.0–52.0)
Hemoglobin: 12.5 g/dL — ABNORMAL LOW (ref 13.0–17.0)
MCH: 35.3 pg — ABNORMAL HIGH (ref 26.0–34.0)
MCHC: 34.2 g/dL (ref 30.0–36.0)
MCV: 103.4 fL — ABNORMAL HIGH (ref 80.0–100.0)
Platelets: 134 10*3/uL — ABNORMAL LOW (ref 150–400)
RBC: 3.54 MIL/uL — ABNORMAL LOW (ref 4.22–5.81)
RDW: 12.9 % (ref 11.5–15.5)
WBC: 5.1 10*3/uL (ref 4.0–10.5)
nRBC: 0 % (ref 0.0–0.2)

## 2023-06-16 LAB — I-STAT CHEM 8, ED
BUN: 25 mg/dL — ABNORMAL HIGH (ref 8–23)
Calcium, Ion: 1.01 mmol/L — ABNORMAL LOW (ref 1.15–1.40)
Chloride: 108 mmol/L (ref 98–111)
Creatinine, Ser: 1.6 mg/dL — ABNORMAL HIGH (ref 0.61–1.24)
Glucose, Bld: 117 mg/dL — ABNORMAL HIGH (ref 70–99)
HCT: 44 % (ref 39.0–52.0)
Hemoglobin: 15 g/dL (ref 13.0–17.0)
Potassium: 4.2 mmol/L (ref 3.5–5.1)
Sodium: 138 mmol/L (ref 135–145)
TCO2: 21 mmol/L — ABNORMAL LOW (ref 22–32)

## 2023-06-16 LAB — CREATININE, SERUM
Creatinine, Ser: 1.53 mg/dL — ABNORMAL HIGH (ref 0.61–1.24)
GFR, Estimated: 49 mL/min — ABNORMAL LOW (ref >60)

## 2023-06-16 LAB — COMPREHENSIVE METABOLIC PANEL
ALT: 20 U/L (ref 0–44)
AST: 26 U/L (ref 15–41)
Albumin: 4 g/dL (ref 3.5–5.0)
Alkaline Phosphatase: 71 U/L (ref 38–126)
Anion gap: 11 (ref 5–15)
BUN: 24 mg/dL — ABNORMAL HIGH (ref 8–23)
CO2: 20 mmol/L — ABNORMAL LOW (ref 22–32)
Calcium: 9 mg/dL (ref 8.9–10.3)
Chloride: 106 mmol/L (ref 98–111)
Creatinine, Ser: 1.58 mg/dL — ABNORMAL HIGH (ref 0.61–1.24)
GFR, Estimated: 47 mL/min — ABNORMAL LOW (ref >60)
Glucose, Bld: 120 mg/dL — ABNORMAL HIGH (ref 70–99)
Potassium: 4.1 mmol/L (ref 3.5–5.1)
Sodium: 137 mmol/L (ref 135–145)
Total Bilirubin: 0.9 mg/dL (ref 0.0–1.2)
Total Protein: 8.1 g/dL (ref 6.5–8.1)

## 2023-06-16 LAB — URINALYSIS, W/ REFLEX TO CULTURE (INFECTION SUSPECTED)
Bacteria, UA: NONE SEEN
Bilirubin Urine: NEGATIVE
Glucose, UA: NEGATIVE mg/dL
Hgb urine dipstick: NEGATIVE
Ketones, ur: 5 mg/dL — AB
Leukocytes,Ua: NEGATIVE
Nitrite: NEGATIVE
Protein, ur: NEGATIVE mg/dL
Specific Gravity, Urine: >1.046 — ABNORMAL HIGH (ref 1.005–1.030)
pH: 5 (ref 5.0–8.0)

## 2023-06-16 LAB — CBC WITH DIFFERENTIAL/PLATELET
Abs Immature Granulocytes: 0.01 10*3/uL (ref 0.00–0.07)
Basophils Absolute: 0 10*3/uL (ref 0.0–0.1)
Basophils Relative: 0 %
Eosinophils Absolute: 0.2 10*3/uL (ref 0.0–0.5)
Eosinophils Relative: 4 %
HCT: 44.5 % (ref 39.0–52.0)
Hemoglobin: 15.2 g/dL (ref 13.0–17.0)
Immature Granulocytes: 0 %
Lymphocytes Relative: 22 %
Lymphs Abs: 1.1 10*3/uL (ref 0.7–4.0)
MCH: 35.8 pg — ABNORMAL HIGH (ref 26.0–34.0)
MCHC: 34.2 g/dL (ref 30.0–36.0)
MCV: 104.7 fL — ABNORMAL HIGH (ref 80.0–100.0)
Monocytes Absolute: 0.3 10*3/uL (ref 0.1–1.0)
Monocytes Relative: 5 %
Neutro Abs: 3.5 10*3/uL (ref 1.7–7.7)
Neutrophils Relative %: 69 %
Platelets: 157 10*3/uL (ref 150–400)
RBC: 4.25 MIL/uL (ref 4.22–5.81)
RDW: 12.8 % (ref 11.5–15.5)
WBC: 5.1 10*3/uL (ref 4.0–10.5)
nRBC: 0 % (ref 0.0–0.2)

## 2023-06-16 LAB — C DIFFICILE QUICK SCREEN W PCR REFLEX
C Diff antigen: POSITIVE — AB
C Diff interpretation: DETECTED
C Diff toxin: POSITIVE — AB

## 2023-06-16 LAB — LIPASE, BLOOD: Lipase: 31 U/L (ref 11–51)

## 2023-06-16 LAB — HIV ANTIBODY (ROUTINE TESTING W REFLEX): HIV Screen 4th Generation wRfx: NONREACTIVE

## 2023-06-16 MED ORDER — TAMSULOSIN HCL 0.4 MG PO CAPS
0.4000 mg | ORAL_CAPSULE | Freq: Every day | ORAL | Status: DC
  Administered 2023-06-16 – 2023-06-17 (×2): 0.4 mg via ORAL
  Filled 2023-06-16 (×2): qty 1

## 2023-06-16 MED ORDER — ALPRAZOLAM 0.5 MG PO TABS: 0.2500 mg | ORAL_TABLET | Freq: Three times a day (TID) | ORAL | Status: DC | PRN

## 2023-06-16 MED ORDER — TIMOLOL MALEATE 0.25 % OP SOLN
1.0000 [drp] | Freq: Every day | OPHTHALMIC | Status: DC
  Administered 2023-06-16 – 2023-06-17 (×2): 1 [drp] via OPHTHALMIC
  Filled 2023-06-16: qty 5

## 2023-06-16 MED ORDER — ONDANSETRON HCL 4 MG/2ML IJ SOLN
4.0000 mg | Freq: Once | INTRAMUSCULAR | Status: DC
  Filled 2023-06-16: qty 2

## 2023-06-16 MED ORDER — IOHEXOL 350 MG/ML SOLN
75.0000 mL | Freq: Once | INTRAVENOUS | Status: AC | PRN
  Administered 2023-06-16: 75 mL via INTRAVENOUS

## 2023-06-16 MED ORDER — LACTATED RINGERS IV SOLN: INTRAVENOUS | Status: AC

## 2023-06-16 MED ORDER — METHOCARBAMOL 500 MG PO TABS: 500.0000 mg | ORAL_TABLET | Freq: Two times a day (BID) | ORAL | Status: DC | PRN

## 2023-06-16 MED ORDER — PREDNISONE 5 MG PO TABS
5.0000 mg | ORAL_TABLET | Freq: Every day | ORAL | Status: DC
  Administered 2023-06-16 – 2023-06-17 (×2): 5 mg via ORAL
  Filled 2023-06-16 (×2): qty 1

## 2023-06-16 MED ORDER — FIDAXOMICIN 200 MG PO TABS
200.0000 mg | ORAL_TABLET | Freq: Two times a day (BID) | ORAL | Status: DC
  Administered 2023-06-16 – 2023-06-17 (×3): 200 mg via ORAL
  Filled 2023-06-16 (×4): qty 1

## 2023-06-16 MED ORDER — ENSURE ENLIVE PO LIQD
237.0000 mL | Freq: Two times a day (BID) | ORAL | Status: DC
  Administered 2023-06-16 – 2023-06-17 (×3): 237 mL via ORAL

## 2023-06-16 MED ORDER — MELATONIN 5 MG PO TABS: 5.0000 mg | ORAL_TABLET | Freq: Every evening | ORAL | Status: DC | PRN

## 2023-06-16 MED ORDER — TACROLIMUS 1 MG PO CAPS
4.0000 mg | ORAL_CAPSULE | Freq: Two times a day (BID) | ORAL | Status: DC
Start: 2023-06-16 — End: 2023-06-17
  Administered 2023-06-16 – 2023-06-17 (×3): 4 mg via ORAL
  Filled 2023-06-16 (×3): qty 4

## 2023-06-16 MED ORDER — LATANOPROST 0.005 % OP SOLN
2.0000 [drp] | Freq: Every day | OPHTHALMIC | Status: DC
  Administered 2023-06-16: 2 [drp] via OPHTHALMIC
  Filled 2023-06-16: qty 2.5

## 2023-06-16 MED ORDER — ENOXAPARIN SODIUM 40 MG/0.4ML IJ SOSY
40.0000 mg | PREFILLED_SYRINGE | Freq: Every day | INTRAMUSCULAR | Status: DC
  Administered 2023-06-16 – 2023-06-17 (×2): 40 mg via SUBCUTANEOUS
  Filled 2023-06-16 (×2): qty 0.4

## 2023-06-16 MED ORDER — ALPRAZOLAM 0.25 MG PO TABS
0.5000 mg | ORAL_TABLET | Freq: Once | ORAL | Status: AC
  Administered 2023-06-16: 0.5 mg via ORAL
  Filled 2023-06-16: qty 2

## 2023-06-16 MED ORDER — ACETAMINOPHEN 325 MG PO TABS: 650.0000 mg | ORAL_TABLET | Freq: Four times a day (QID) | ORAL | Status: DC | PRN

## 2023-06-16 MED ORDER — ONDANSETRON HCL 4 MG/2ML IJ SOLN
4.0000 mg | Freq: Four times a day (QID) | INTRAMUSCULAR | Status: DC | PRN
  Administered 2023-06-16 (×2): 4 mg via INTRAVENOUS
  Filled 2023-06-16: qty 2

## 2023-06-16 MED ORDER — PANTOPRAZOLE SODIUM 40 MG PO TBEC
40.0000 mg | DELAYED_RELEASE_TABLET | Freq: Every day | ORAL | Status: DC
  Administered 2023-06-17: 40 mg via ORAL
  Filled 2023-06-16: qty 1

## 2023-06-16 MED ORDER — LACTATED RINGERS IV BOLUS
1000.0000 mL | Freq: Once | INTRAVENOUS | Status: AC
  Administered 2023-06-16: 1000 mL via INTRAVENOUS

## 2023-06-16 MED ORDER — SULFAMETHOXAZOLE-TRIMETHOPRIM 400-80 MG PO TABS
1.0000 | ORAL_TABLET | Freq: Every day | ORAL | Status: DC
  Administered 2023-06-16 – 2023-06-17 (×2): 1 via ORAL
  Filled 2023-06-16 (×2): qty 1

## 2023-06-16 MED ORDER — ALUM & MAG HYDROXIDE-SIMETH 200-200-20 MG/5ML PO SUSP
30.0000 mL | Freq: Once | ORAL | Status: AC
  Administered 2023-06-16: 30 mL via ORAL
  Filled 2023-06-16: qty 30

## 2023-06-16 NOTE — ED Provider Notes (Signed)
 [place] EMERGENCY DEPARTMENT AT [facility] Provider Note   CSN: [id number] Arrival date & time: 06/15/23  2326     History  Chief Complaint  Patient presents with   Abdominal Pain   Nausea    Jacob Erickson is a 68 y.o. 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 Patient stating that he had similar symptoms 3 weeks ago that resolved with GI cocktail. Patient also endorsing nausea but denies vomiting stating that he had a surgery in the past on his stomach and he can no longer vomit. Patient endorsing diarrhea x2 days which seems to be d/t recent stool softener use. Last BM today. Patient also stating that he has generalized fatigue d/t being dehydrated.  Denies fever, chest pain, dyspnea, cough, vomiting, dysuria, hematuria, hematochezia.    Abdominal Pain      Home Medications Prior to Admission medications   Medication Sig Start Date End Date Taking? Authorizing Provider  aspirin 81 MG chewable tablet Chew 81 mg by mouth daily. Patient not taking: Reported on 04/30/2022    [provider]  atorvastatin (LIPITOR) 20 MG tablet Take 20 mg by mouth daily.    [provider]  azithromycin (ZITHROMAX) 250 MG tablet Take 250 mg by mouth daily.    [provider]  calcium-vitamin D (OSCAL WITH D) 250-125 MG-UNIT per tablet Take 2 tablets by mouth 2 (two) times daily.    [provider]  cyclobenzaprine (FLEXERIL) 10 MG tablet Take 1 tablet (10 mg total) by mouth 2 (two) times daily as needed for muscle spasms. 08/23/22   Fayrene Helper, PA-C  diclofenac Sodium (VOLTAREN ARTHRITIS PAIN) 1 % GEL Apply 2 g topically 4 (four) times daily. 08/23/22   Fayrene Helper, PA-C  ezetimibe (ZETIA) 10 MG tablet Take 10 mg by mouth daily.    [provider]  ferrous sulfate 325 (65 FE) MG tablet Take 325 mg by mouth 2 (two)  times daily with a meal.    [provider]  guaiFENesin (MUCINEX) 600 MG 12 hr tablet Take 600 mg by mouth 2 (two) times daily as needed for cough or to loosen phlegm.    [provider]  HYDROcodone-acetaminophen (NORCO/VICODIN) 5-325 MG per tablet Take 1 tablet by mouth every 6 (six) hours as needed for moderate pain or severe pain. 04/19/14   Antony Madura, PA-C  ibandronate (BONIVA) 150 MG tablet Take 150 mg by mouth every 30 (thirty) days. Take in the morning with a full glass of water, on an empty stomach, and do not take anything else by mouth or lie down for the next 30 min.    [provider]  latanoprost (XALATAN) 0.005 % ophthalmic solution Place 2 drops into both eyes at bedtime.    [provider]  levofloxacin (LEVAQUIN) 750 MG tablet Take 1 tablet (750 mg total) by mouth daily. Patient not taking: Reported on 06/15/2015 11/23/14   [name], PA-C  lidocaine (LIDODERM) 5 % Place 1 patch onto the skin daily. Remove & Discard patch within 12 hours or as directed by MD 08/16/21   Prosperi, Ephriam Knuckles H, PA-C  lidocaine (XYLOCAINE) 2 % solution Use as directed 15 mLs in the mouth or throat every 4 (four) hours as needed for mouth pain. 06/08/19   Garlon Hatchet, PA-C  meloxicam (MOBIC) 15 MG tablet Take 1 tablet (15 mg total) by mouth  daily. 08/16/21   Prosperi, Christian H, PA-C  metoprolol tartrate (LOPRESSOR) 25 MG tablet Take 25 mg by mouth 2 (two) times daily.    [provider]  Multiple Vitamin (MULTIVITAMIN WITH MINERALS) TABS tablet Take 1 tablet by mouth daily.    [provider]  Multiple Vitamins-Minerals (PRESERVISION AREDS 2 PO) Take 1 tablet by mouth daily.    [provider]  nitroGLYCERIN (NITROSTAT) 0.4 MG SL tablet Place 0.4 mg under the tongue every 5 (five) minutes as needed for chest pain.    [provider]  predniSONE (DELTASONE) 5 MG tablet Take 5 mg by mouth daily with breakfast.    [provider]  sulfamethoxazole-trimethoprim (BACTRIM,SEPTRA) 400-80 MG tablet Take 1 tablet by mouth daily.    [provider]  tacrolimus (PROGRAF) 1 MG capsule Take 6 mg by mouth 2 (two) times daily.     [provider]      Allergies    Patient has no known allergies.    Review of Systems   Review of Systems  Gastrointestinal:  Positive for abdominal pain.    Physical Exam Updated Vital Signs BP 122/64   Pulse 94   Temp 98.6 F (37 C) (Oral)   Resp (!) 25   Ht 6\' 2"  (1.88 m)   Wt 72.1 kg   SpO2 100%   BMI 20.41 kg/m  Physical Exam Vitals and nursing note reviewed.  Constitutional:      General: He is not in acute distress.    Appearance: He is not ill-appearing or toxic-appearing.  HENT:     Head: Normocephalic and atraumatic.     Mouth/Throat:     Mouth: Mucous membranes are moist.     Pharynx: No oropharyngeal exudate or posterior oropharyngeal erythema.  Eyes:     General: No scleral icterus.       Right eye: No discharge.        Left eye: No discharge.     Conjunctiva/sclera: Conjunctivae normal.  Cardiovascular:     Rate and Rhythm: Normal rate and regular rhythm.     Pulses: Normal pulses.     Heart sounds: No murmur heard. Pulmonary:     Effort: Pulmonary effort is normal. No respiratory distress.     Breath sounds: Normal breath sounds. No wheezing, rhonchi or rales.  Abdominal:     General: Abdomen is flat. Bowel sounds are normal. There is no distension.     Palpations: Abdomen is soft. There is no mass.     Tenderness: There is abdominal tenderness in the periumbilical area.  Musculoskeletal:     Right lower leg: No edema.     Left lower leg: No edema.  Skin:    General: Skin is warm and dry.     Findings: No rash.  Neurological:     General: No focal deficit present.     Mental Status: He is alert and oriented to person, place, and time. Mental status is at baseline.  Psychiatric:        Mood and Affect: Mood normal.      ED Results / Procedures / Treatments   Labs (all labs ordered are listed, but only abnormal results are displayed) Labs Reviewed  COMPREHENSIVE METABOLIC PANEL - Abnormal; Notable for the following components:      Result Value   CO2 20 (*)    Glucose, Bld 120 (*)    BUN 24 (*)    Creatinine, Ser 1.58 (*)  GFR, Estimated 47 (*)    All other components within normal limits  CBC WITH DIFFERENTIAL/PLATELET - Abnormal; Notable for the following components:   MCV 104.7 (*)    MCH 35.8 (*)    All other components within normal limits  URINALYSIS, W/ REFLEX TO CULTURE (INFECTION SUSPECTED) - Abnormal; Notable for the following components:   Specific Gravity, Urine >1.046 (*)    Ketones, ur 5 (*)    All other components within normal limits  I-STAT CHEM 8, ED - Abnormal; Notable for the following components:   BUN 25 (*)    Creatinine, Ser 1.60 (*)    Glucose, Bld 117 (*)    Calcium, Ion 1.01 (*)    TCO2 21 (*)    All other components within normal limits  GASTROINTESTINAL PANEL BY PCR, STOOL (REPLACES STOOL CULTURE)  C DIFFICILE QUICK SCREEN W PCR REFLEX    LIPASE, BLOOD    EKG None  Radiology CT ABDOMEN PELVIS W CONTRAST Result Date: 06/16/2023 CLINICAL DATA:  Acute abdominal pain with nausea, initial encounter EXAM: CT ABDOMEN AND PELVIS WITH CONTRAST TECHNIQUE: Multidetector CT imaging of the abdomen and pelvis was performed using the standard protocol following bolus administration of intravenous contrast. RADIATION DOSE REDUCTION: This exam was performed according to the departmental dose-optimization program which includes automated exposure control, adjustment of the mA and/or kV according to patient size and/or use of iterative reconstruction technique. CONTRAST:  75mL OMNIPAQUE IOHEXOL 350 MG/ML SOLN COMPARISON:  05/26/2023 FINDINGS: Lower chest: Mild scarring is noted in the left base. Hepatobiliary: No focal liver abnormality is seen. No gallstones, gallbladder wall  thickening, or biliary dilatation. Pancreas: Unremarkable. No pancreatic ductal dilatation or surrounding inflammatory changes. Spleen: Normal in size without focal abnormality. Adrenals/Urinary Tract: Adrenal glands are within normal limits. Kidneys demonstrate a normal enhancement pattern bilaterally. Stable left renal cyst is noted. No follow-up is recommended. No obstructive changes are seen. The bladder is decompressed. Stomach/Bowel: The sigmoid and rectum are again filled with fluid although no true obstructive changes are seen. The more proximal colon demonstrates fluid likely related to a diarrheal state. The appendix is not well visualized and may have been surgically removed. Surgical clips are noted about the gastroesophageal junction consistent with the given clinical history of fundoplication. Small bowel appears within normal limits. Appendix is not well seen. Vascular/Lymphatic: No significant vascular findings are present. No enlarged abdominal or pelvic lymph nodes. Reproductive: Prostate is unremarkable. Other: No abdominal wall hernia or abnormality. No abdominopelvic ascites. Musculoskeletal: No acute or significant osseous findings. IMPRESSION: Fluid-filled colon similar to that seen on the prior exam which may represent a diarrheal state. No other acute abnormality is noted. Electronically Signed   By: Alcide Clever M.D.   On: 06/16/2023 01:50    Procedures .Critical Care  Performed by: Dorthy Cooler, PA-C Authorized by: Dorthy Cooler, PA-C   Critical care provider statement:    Critical care time (minutes):  30   Critical care was necessary to treat or prevent imminent or life-threatening deterioration of the following conditions: AKI.   Critical care was time spent personally by me on the following activities:  Development of treatment plan with patient or surrogate, discussions with consultants, evaluation of patient's response to treatment, examination of patient,  ordering and review of laboratory studies, ordering and review of radiographic studies, ordering and performing treatments and interventions, pulse oximetry, re-evaluation of patient's condition and review of old charts   Care discussed with: admitting provider  Comments:     AKI and poor PO intake requiring admission     Medications Ordered in ED Medications  ondansetron (ZOFRAN) injection 4 mg (4 mg Intravenous Given 06/16/23 0342)  ondansetron (ZOFRAN) injection 4 mg ( Intravenous Canceled Entry 06/16/23 0345)  alum & mag hydroxide-simeth (MAALOX/MYLANTA) 200-200-20 MG/5ML suspension 30 mL (30 mLs Oral Given 06/16/23 0040)  lactated ringers bolus 1,000 mL (0 mLs Intravenous Stopped 06/16/23 0240)  iohexol (OMNIPAQUE) 350 MG/ML injection 75 mL (75 mLs Intravenous Contrast Given 06/16/23 0140)  ALPRAZolam (XANAX) tablet 0.5 mg (0.5 mg Oral Given 06/16/23 0347)    ED Course/ Medical Decision Making/ A&P                                 Medical Decision Making Amount and/or Complexity of Data Reviewed Labs: ordered. Radiology: ordered.  Risk OTC drugs. Prescription drug management.    This patient presents to the ED for concern of abdominal pain, this involves an extensive number of treatment options, and is a complaint that carries with it a high risk of complications and morbidity.  The differential diagnosis includes gastroenteritis, colitis, small bowel obstruction, appendicitis, cholecystitis, pancreatitis, nephrolithiasis, UTI, pyleonephritis, testicular torsion.   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 Clelia Croft PCP   Problem List / ED Course / Critical interventions / Medication management  Admitting patient for AKI and poor PO intake Patient presented for central abdominal pain, nausea, and diarrhea. Physical exam with tenderness to  palpation of central abdomen.  Rest of physical exam reassuring.  Patient afebrile with stable vitals. I Ordered, and personally interpreted labs.  CBC without leukocytosis or anemia.  CMP with elevation in BUN/creatinine past patient's baseline now at 24/1.58.  UA not concerning for infection.  Lipase within normal limits.  Stool PCR pending. I ordered imaging studies including CT Abd/Pelvis with contrast: evaluate for structural/surgical etiology of patients' severe abdominal pain. I independently visualized and interpreted imaging and I agree with the radiologist interpretation of diarrhea. Provided patient with IV fluids and zofran. Patient stating that he is still having difficulties with PO intake d/t his symptoms and is requesting admission. Dr. Margo Aye admitting provider. I have reviewed the patients home medicines and have made adjustments as needed   Social Determinants of Health:  none          Final Clinical Impression(s) / ED Diagnoses Final diagnoses:  AKI (acute kidney injury) [facility])    Rx / DC Orders ED Discharge Orders     None         Dorthy Cooler, [place] 06/16/23 0421    Shon Baton, MD 06/16/23 [phone]

## 2023-06-16 NOTE — H&P (Signed)
 History and Physical  Jacob Erickson:[id number] DOB: 04/01/1955 DOA: 06/15/2023  Referring physician: Dannielle Karvonen  PCP: Cleatis Polka., MD  Outpatient Specialists: Pulmonary, transplant team, follows at [facility]. Patient coming from: Home.  Chief Complaint: Intractable nausea and diarrhea  HPI: Jacob Erickson is a 68 y.o. 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  Associated with abdominal pain x 3 days, watery stools, and generalized weakness.  Endorses he had similar symptoms about 3 weeks ago which resolved with a GI cocktail.  Admits to recent use of stool softeners.  In the ER, afebrile, with no leukocytosis.  CT abdomen pelvis with contrast revealed fluid-filled colon similar to that seen on the prior exam which may represent a diarrheal state.  No other acute abnormalities seen.  The patient received IV fluid bolus LR 500 cc x 1 and IV antiemetics.  Lab work notable for elevated creatinine above baseline.  EDP requested admission due to AKI.  Admitted by [facility], hospitalist service.  ED Course: Temperature 0.6.  BP 121/68, pulse 90, respiratory 21, O2 saturation 100% on room air.  Lab studies notable for WBC 5.1, hemoglobin 15.0, MCV 104, platelet 157.  BUN 25, creatinine 1.60.  GFR 47.  Review of Systems: Review of systems as noted in the HPI. All other systems reviewed and are negative.   Past Medical History:  Diagnosis Date   Coronary artery disease    High cholesterol    Hypertension    Past Surgical History:  Procedure Laterality Date   CORONARY ANGIOPLASTY WITH STENT PLACEMENT     IR KYPHO EA ADDL LEVEL THORACIC OR LUMBAR  05/14/2022   IR KYPHO EA ADDL  LEVEL THORACIC OR LUMBAR  05/14/2022   IR KYPHO THORACIC WITH BONE BIOPSY  05/14/2022   IR RADIOLOGIST EVAL & MGMT  04/30/2022   IR RADIOLOGIST EVAL & MGMT  05/28/2022   LUNG TRANSPLANT, DOUBLE     NISSEN FUNDOPLICATION      Social History:  reports that he has never smoked. He has never been exposed to tobacco smoke. He has never used smokeless tobacco. He reports that he does not drink alcohol and does not use drugs.   No Known Allergies  Family history: None reported.  Prior to Admission medications   Medication Sig Start Date End Date Taking? Authorizing Provider  aspirin 81 MG chewable tablet Chew 81 mg by mouth daily. Patient not taking: Reported on 04/30/2022    [provider]  atorvastatin (LIPITOR) 20 MG tablet Take 20 mg by mouth daily.    [provider]  azithromycin (ZITHROMAX) 250 MG tablet Take 250 mg by mouth daily.    [provider]  calcium-vitamin D (OSCAL WITH D) 250-125 MG-UNIT per tablet Take 2 tablets by mouth 2 (two) times daily.    [provider]  cyclobenzaprine (FLEXERIL) 10 MG tablet Take 1 tablet (10 mg total) by mouth 2 (two) times daily as needed for muscle spasms. 08/23/22   Fayrene Helper, PA-C  diclofenac Sodium (VOLTAREN ARTHRITIS PAIN) 1 % GEL Apply 2 g topically 4 (four) times daily. 08/23/22   Fayrene Helper, PA-C  ezetimibe (ZETIA) 10 MG tablet Take 10 mg by mouth daily.    [provider]  ferrous sulfate 325 (65  FE) MG tablet Take 325 mg by mouth 2 (two) times daily with a meal.    [provider]  guaiFENesin (MUCINEX) 600 MG 12 hr tablet Take 600 mg by mouth 2 (two) times daily as needed for cough or to loosen phlegm.    [provider]  HYDROcodone-acetaminophen (NORCO/VICODIN) 5-325 MG per tablet Take 1 tablet by mouth every 6 (six) hours as needed for moderate pain or severe pain. 04/19/14   Antony Madura, PA-C  ibandronate (BONIVA) 150 MG tablet Take 150 mg by mouth every 30 (thirty) days. Take  in the morning with a full glass of water, on an empty stomach, and do not take anything else by mouth or lie down for the next 30 min.    [provider]  latanoprost (XALATAN) 0.005 % ophthalmic solution Place 2 drops into both eyes at bedtime.    [provider]  levofloxacin (LEVAQUIN) 750 MG tablet Take 1 tablet (750 mg total) by mouth daily. Patient not taking: Reported on 06/15/2015 11/23/14   [name], PA-C  lidocaine (LIDODERM) 5 % Place 1 patch onto the skin daily. Remove & Discard patch within 12 hours or as directed by MD 08/16/21   Prosperi, Ephriam Knuckles H, PA-C  lidocaine (XYLOCAINE) 2 % solution Use as directed 15 mLs in the mouth or throat every 4 (four) hours as needed for mouth pain. 06/08/19   Garlon Hatchet, PA-C  meloxicam (MOBIC) 15 MG tablet Take 1 tablet (15 mg total) by mouth daily. 08/16/21   Prosperi, Christian H, PA-C  metoprolol tartrate (LOPRESSOR) 25 MG tablet Take 25 mg by mouth 2 (two) times daily.    [provider]  Multiple Vitamin (MULTIVITAMIN WITH MINERALS) TABS tablet Take 1 tablet by mouth daily.    [provider]  Multiple Vitamins-Minerals (PRESERVISION AREDS 2 PO) Take 1 tablet by mouth daily.    [provider]  nitroGLYCERIN (NITROSTAT) 0.4 MG SL tablet Place 0.4 mg under the tongue every 5 (five) minutes as needed for chest pain.    [provider]  predniSONE (DELTASONE) 5 MG tablet Take 5 mg by mouth daily with breakfast.    [provider]  sulfamethoxazole-trimethoprim (BACTRIM,SEPTRA) 400-80 MG tablet Take 1 tablet by mouth daily.    [provider]  tacrolimus (PROGRAF) 1 MG capsule Take 6 mg by mouth 2 (two) times daily.     [provider]    Physical Exam: BP 122/64   Pulse 94   Temp 98.6 F (37 C) (Oral)   Resp (!) 25   Ht 6\' 2"  (1.88 m)   Wt 72.1 kg   SpO2 100%   BMI 20.41 kg/m   General: 68 y.o. year-old male well developed well nourished in no acute  distress.  Alert and oriented x3. Cardiovascular: Regular rate and rhythm with no rubs or gallops.  No thyromegaly or JVD noted.  No lower extremity edema. 2/4 pulses in all 4 extremities. Respiratory: Clear to auscultation with no wheezes or rales. Good inspiratory effort. Abdomen: Soft tender nondistended with normal bowel sounds x4 quadrants. Muskuloskeletal: No cyanosis, clubbing or edema noted bilaterally Neuro: CN II-XII intact, strength, sensation, reflexes Skin: No ulcerative lesions noted or rashes Psychiatry: Judgement and insight appear normal. Mood is appropriate for condition and setting          Labs on Admission:  Basic Metabolic Panel: Recent Labs  Lab 06/15/23 2356 06/16/23 0005  NA 137 138  K 4.1 4.2  CL  106 108  CO2 20*  --   GLUCOSE 120* 117*  BUN 24* 25*  CREATININE 1.58* 1.60*  CALCIUM 9.0  --    Liver Function Tests: Recent Labs  Lab 06/15/23 2356  AST 26  ALT 20  ALKPHOS 71  BILITOT 0.9  PROT 8.1  ALBUMIN 4.0   Recent Labs  Lab 06/15/23 2356  LIPASE 31   No results for input(s): "AMMONIA" in the last 168 hours. CBC: Recent Labs  Lab 06/15/23 2356 06/16/23 0005  WBC 5.1  --   NEUTROABS 3.5  --   HGB 15.2 15.0  HCT 44.5 44.0  MCV 104.7*  --   PLT 157  --    Cardiac Enzymes: No results for input(s): "CKTOTAL", "CKMB", "CKMBINDEX", "TROPONINI" in the last 168 hours.  BNP (last 3 results) No results for input(s): "BNP" in the last 8760 hours.  ProBNP (last 3 results) No results for input(s): "PROBNP" in the last 8760 hours.  CBG: No results for input(s): "GLUCAP" in the last 168 hours.  Radiological Exams on Admission: CT ABDOMEN PELVIS W CONTRAST Result Date: 06/16/2023 CLINICAL DATA:  Acute abdominal pain with nausea, initial encounter EXAM: CT ABDOMEN AND PELVIS WITH CONTRAST TECHNIQUE: Multidetector CT imaging of the abdomen and pelvis was performed using the standard protocol following bolus administration of intravenous  contrast. RADIATION DOSE REDUCTION: This exam was performed according to the departmental dose-optimization program which includes automated exposure control, adjustment of the mA and/or kV according to patient size and/or use of iterative reconstruction technique. CONTRAST:  75mL OMNIPAQUE IOHEXOL 350 MG/ML SOLN COMPARISON:  05/26/2023 FINDINGS: Lower chest: Mild scarring is noted in the left base. Hepatobiliary: No focal liver abnormality is seen. No gallstones, gallbladder wall thickening, or biliary dilatation. Pancreas: Unremarkable. No pancreatic ductal dilatation or surrounding inflammatory changes. Spleen: Normal in size without focal abnormality. Adrenals/Urinary Tract: Adrenal glands are within normal limits. Kidneys demonstrate a normal enhancement pattern bilaterally. Stable left renal cyst is noted. No follow-up is recommended. No obstructive changes are seen. The bladder is decompressed. Stomach/Bowel: The sigmoid and rectum are again filled with fluid although no true obstructive changes are seen. The more proximal colon demonstrates fluid likely related to a diarrheal state. The appendix is not well visualized and may have been surgically removed. Surgical clips are noted about the gastroesophageal junction consistent with the given clinical history of fundoplication. Small bowel appears within normal limits. Appendix is not well seen. Vascular/Lymphatic: No significant vascular findings are present. No enlarged abdominal or pelvic lymph nodes. Reproductive: Prostate is unremarkable. Other: No abdominal wall hernia or abnormality. No abdominopelvic ascites. Musculoskeletal: No acute or significant osseous findings. IMPRESSION: Fluid-filled colon similar to that seen on the prior exam which may represent a diarrheal state. No other acute abnormality is noted. Electronically Signed   By: Alcide Clever M.D.   On: 06/16/2023 01:50    EKG: I independently viewed the EKG done and my findings are as  followed: Sinus rhythm rate of 96.  Nonspecific ST changes.  QTc 447.  Assessment/Plan Present on Admission:  Intractable nausea  Principal Problem:   Intractable nausea  Intractable nausea, diarrhea, unclear etiology History of Nissen fundoplication Afebrile with no leukocytosis Recent stool tests negative for C. difficile/GI panel by PCR on 05/26/2023 Stool tests ordered by EDP Continue supportive care IV antiemetics as needed Continue IV fluid hydration LR 75 cc/h x 1 day Replete electrolytes as indicated.  AKI, multifactorial, including prerenal secondary to poor oral intake At baseline  creatinine 1.1 with GFR 60 Presented with creatinine 1.60 with GFR 47. Avoid nephrotoxic agents, dehydration, and hypotension IV fluid hydration LR 75 cc/h x 1 day Monitor urine output.  Sarcoidosis status post lung transplantation in 2002 Follows at [facility] Resume home immunosuppressants/prophylactic regimen No acute issues  Generalized weakness PT OT evaluation Fall precautions.   Time: 75 minutes.   DVT prophylaxis: Subcu Lovenox daily  Code Status: Full code.  Family Communication: The patient's wife at bedside.  Disposition Plan: Admitted to telemetry medical unit.  Consults called: None.  Admission status: Observation status.   Status is: Observation    Darlin Drop MD [facility] Pager [phone]  If 7PM-7AM, please contact night-coverage [email or web address] Password [facility]  06/16/2023, 4:33 AM

## 2023-06-16 NOTE — TOC CM/SW Note (Addendum)
 Transition of Care [facility]) - Inpatient Brief Assessment   Patient Details  Name: Jacob Erickson MRN: [id number] Date of Birth: [birth year]  Transition of Care [facility]) CM/SW Contact:    Tom-Johnson, Hershal Coria, RN Phone Number: 06/16/2023, 4:57 PM   Clinical Narrative:  Patient presented to the ED with Abdominal pain with intractable Nausea and no Vomiting d/t Fundoplication and Diarrhea. Admitted with AKI. Patient has hx of CAD, s/p PCI with Stent placement in 2016, Sarcoidosis s/p Lung transplant in 2002, currently on immunosuppressants, Nissen Fundoplication.  From home with wife and daughter, has four children. Currently on disability. Has a walker, bsc shower seat and grab bars at home. PCP is Cleatis Polka., MD and uses [facility] on [address] [facility] and [facility].  No PT/OT f/u noted.   Patient not Medically ready for discharge.  CM will continue to follow as patient progresses with care towards discharge.              Transition of Care Asessment: Insurance and Status: Insurance coverage has been reviewed Patient has primary care physician: Yes Home environment has been reviewed: Yes Prior level of function:: Independent Prior/Current Home Services: No current home services Social Drivers of Health Review: SDOH reviewed no interventions necessary Readmission risk has been reviewed: Yes Transition of care needs: no transition of care needs at this time

## 2023-06-16 NOTE — Evaluation (Signed)
 Physical Therapy Evaluation Patient Details Name: Jacob Erickson MRN: [id number] DOB: 01-11-1956 Today's Date: 06/16/2023  History of Present Illness  68 y.o. male presents to the ER 3/25 due to intractable nausea without vomiting due to fundoplication and diarrhea. CT abdomen pelvis with contrast revealed fluid-filled colon similar to that seen on the prior exam. 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 Pt is currently limited in safe mobility by fatigue from limited sleep, stomach cramping, generalized weakness from ongoing diarrhea and dizziness likely due to decreased BP from dehydration . Pt is mod I for bed mobility and transfers to and from [facility]. Ambulation deferred due to dizziness. Pt will likely be able to discharge home without additional PT services. PT will continue to follow acutely.         If plan is discharge home, recommend the following: Help with stairs or ramp for entrance;A little help with walking and/or transfers;A little help with bathing/dressing/bathroom   Can travel by private vehicle    Yes    Equipment Recommendations None recommended by PT     Functional Status Assessment Patient has had a recent decline in their functional status and demonstrates the ability to make significant improvements in function in a reasonable and predictable amount of time.     Precautions / Restrictions Precautions Precautions: None      Mobility  Bed Mobility Overal bed mobility: Modified Independent             General bed mobility comments: HoB elevated and use of rail to pull to EoB, use of rails to return to supine    Transfers Overall transfer level: Modified  independent Equipment used: None               General transfer comment: slowed but adequate power up and self steadying, dizziness with standing, able to perfrom step pivot to and from [facility]    Ambulation/Gait               General Gait Details: deferred due to dizziness        Balance Overall balance assessment: Mild deficits observed, not formally tested                                           Pertinent Vitals/Pain Pain Assessment Pain Assessment: Faces Faces Pain Scale: Hurts even more Pain Location: stomach Pain Descriptors / Indicators: Cramping, Grimacing, Guarding Pain Intervention(s): Limited activity within patient's tolerance, Monitored during session, Repositioned    Home Living Family/patient expects to be discharged to:: Private residence Living Arrangements: Spouse/significant other;Children Available Help at Discharge: Family;Available 24 hours/day Type of Home: House Home Access: Stairs to enter Entrance Stairs-Rails: Can reach both Entrance Stairs-Number of Steps: 5   Home Layout: One level Home Equipment: Agricultural consultant (2 wheels);Rollator (4 wheels);Grab bars - tub/shower;Hand held shower head;BSC/3in1;Tub bench Additional Comments: have had 56 foster children 2 of which they adopted    Prior Function Prior Level of Function : Driving;Independent/Modified Independent                     Extremity/Trunk Assessment   Upper Extremity Assessment Upper Extremity  Assessment: Defer to OT evaluation    Lower Extremity Assessment Lower Extremity Assessment: Overall WFL for tasks assessed    Cervical / Trunk Assessment Cervical / Trunk Assessment: Kyphotic  Communication   Communication Communication: No apparent difficulties    Cognition Arousal: Lethargic (awake all night in ED) Behavior During Therapy: WFL for tasks assessed/performed   PT - Cognitive impairments: No apparent impairments                          Following commands: Intact       Cueing Cueing Techniques: Verbal cues     General Comments General comments (skin integrity, edema, etc.): Pt with diziness with standing, unable to get BP due to lack of Dinamap        Assessment/Plan    PT Assessment Patient needs continued PT services  PT Problem List Decreased strength;Decreased activity tolerance;Decreased balance;Pain;Cardiopulmonary status limiting activity       PT Treatment Interventions DME instruction;Gait training;Stair training;Functional mobility training;Therapeutic activities;Therapeutic exercise;Balance training;Patient/family education    PT Goals (Current goals can be found in the Care Plan section)  Acute Rehab PT Goals PT Goal Formulation: With patient/family Time For Goal Achievement: 06/30/23 Potential to Achieve Goals: Good    Frequency Min 2X/week        AM-PAC PT "6 Clicks" Mobility  Outcome Measure Help needed turning from your back to your side while in a flat bed without using bedrails?: None Help needed moving from lying on your back to sitting on the side of a flat bed without using bedrails?: None Help needed moving to and from a bed to a chair (including a wheelchair)?: None Help needed standing up from a chair using your arms (e.g., wheelchair or bedside chair)?: None Help needed to walk in hospital room?: A Lot Help needed climbing 3-5 steps with a railing? : Total 6 Click Score: 19    End of Session Equipment Utilized During Treatment: Gait belt Activity Tolerance: Patient limited by fatigue;Patient limited by lethargy;Patient limited by pain;Treatment limited secondary to medical complications (Comment) (dizziness with standing) Patient left: in bed;with call bell/phone within reach;with bed alarm set;with family/visitor present Nurse Communication: Other (comment);Mobility status (watch BP) PT Visit Diagnosis: Dizziness and giddiness (R42);Muscle weakness  (generalized) (M62.81)    Time: 6295-2841 PT Time Calculation (min) (ACUTE ONLY): 25 min   Charges:   PT Evaluation $PT Eval Low Complexity: 1 Low PT Treatments $Therapeutic Activity: 8-22 mins PT General Charges $$ ACUTE PT VISIT: 1 Visit         Jacob Erickson B. Beverely Risen PT, DPT Acute Rehabilitation Services Please use secure chat or  Call Office [phone]   Elon Alas Fleet 06/16/2023, 10:12 AM

## 2023-06-16 NOTE — Evaluation (Signed)
 Occupational Therapy Evaluation Patient Details Name: Jacob Erickson MRN: [id number] DOB: Aug 02, 1955 Today's Date: 06/16/2023   History of Present Illness   68 y.o. male presents to the ER 3/25 due to intractable nausea without vomiting due to fundoplication and diarrhea. CT abdomen pelvis with contrast revealed fluid-filled colon similar to that seen on the prior exam. 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 Pt currently needing supervision for ADLs, mod I for bed mobility and transfers without AD. Pt with mild orthostatics, denies dizziness, see below. Pt presenting with impairments listed below, will follow acutely. Anticipate no OT follow up needs at d/c.   BP seated EOB 116/72 (81) BP standing 96/84 (89) BP standing post ambulation 128/79 (93)     If plan is discharge home, recommend the following:   A little help with bathing/dressing/bathroom;Assist for transportation;Assistance with cooking/housework     Functional Status Assessment   Patient has had a recent decline in their functional status and demonstrates the ability to make significant improvements in function in a reasonable and predictable amount of time.     Equipment Recommendations   None recommended by OT     Recommendations for Other Services   PT consult     Precautions/Restrictions   Precautions Precautions: None Restrictions Weight Bearing Restrictions Per Provider Order: No     Mobility Bed Mobility Overal bed mobility: Modified Independent                  Transfers Overall transfer level: Modified independent Equipment used: None                       Balance Overall balance assessment: Mild deficits observed, not formally tested                                         ADL either performed or assessed with clinical judgement   ADL Overall ADL's : Needs assistance/impaired Eating/Feeding: Supervision/ safety   Grooming: Supervision/safety   Upper Body Bathing: Supervision/ safety   Lower Body Bathing: Supervison/ safety   Upper Body Dressing : Supervision/safety   Lower Body Dressing: Supervision/safety   Toilet Transfer: Supervision/safety   Toileting- Clothing Manipulation and Hygiene: Supervision/safety       Functional mobility during ADLs: Supervision/safety       Vision   Vision Assessment?: No apparent visual deficits     Perception Perception: Not tested       Praxis Praxis: Not tested       Pertinent Vitals/Pain Pain Assessment Pain Assessment: Faces Pain Score: 2  Faces Pain Scale: Hurts a little bit Pain Location: stiff neck Pain Descriptors / Indicators: Discomfort Pain Intervention(s): Limited activity within patient's tolerance, Monitored during session     Extremity/Trunk Assessment Upper Extremity Assessment Upper Extremity Assessment: Generalized weakness   Lower Extremity Assessment Lower Extremity Assessment: Defer to PT evaluation   Cervical / Trunk Assessment Cervical / Trunk Assessment: Kyphotic   Communication Communication Communication: No apparent difficulties   Cognition Arousal: Alert Behavior During Therapy: WFL for tasks assessed/performed Cognition: No apparent impairments  Following commands: Intact       Cueing  General Comments   Cueing Techniques: Verbal cues  mild orthostatic but pt denies dizziness   Exercises     Shoulder Instructions      Home Living Family/patient expects to be discharged to:: Private residence Living Arrangements: Spouse/significant other;Children Available Help  at Discharge: Family;Available 24 hours/day Type of Home: House Home Access: Stairs to enter [employer] of Steps: 5 Entrance Stairs-Rails: Can reach both Home Layout: One level     Bathroom Shower/Tub: Chief Strategy Officer: Handicapped height Bathroom Accessibility: Yes   Home Equipment: Agricultural consultant (2 wheels);Rollator (4 wheels);Grab bars - tub/shower;Hand held shower head;BSC/3in1;Tub bench          Prior Functioning/Environment Prior Level of Function : Driving;Independent/Modified Independent               ADLs Comments: ind with ADL/med mgmt    OT Problem List: Decreased strength;Decreased range of motion;Decreased activity tolerance   OT Treatment/Interventions: Self-care/ADL training;Therapeutic exercise;Energy conservation;DME and/or AE instruction;Therapeutic activities;Patient/family education;Balance training      OT Goals(Current goals can be found in the care plan section)   Acute Rehab OT Goals Patient Stated Goal: none stated OT Goal Formulation: With patient Time For Goal Achievement: 06/30/23 Potential to Achieve Goals: Good ADL Goals Pt Will Perform Upper Body Dressing: Independently Pt Will Perform Lower Body Dressing: Independently Pt Will Transfer to Toilet: Independently Pt Will Perform Tub/Shower Transfer: Tub transfer;Shower transfer;Independently   OT Frequency:  Min 1X/week    Co-evaluation              AM-PAC OT "6 Clicks" Daily Activity     Outcome Measure Help from another person eating meals?: A Little Help from another person taking care of personal grooming?: A Little Help from another person toileting, which includes using toliet, bedpan, or urinal?: A Little Help from another person bathing (including washing, rinsing, drying)?: A Little Help from another person to put on and taking off regular upper body clothing?: A Little Help from another person to put on and taking off regular lower  body clothing?: A Little 6 Click Score: 18   End of Session Equipment Utilized During Treatment: Gait belt Nurse Communication: Mobility status  Activity Tolerance: Patient tolerated treatment well Patient left: in chair;with call bell/phone within reach  OT Visit Diagnosis: Unsteadiness on feet (R26.81);Other abnormalities of gait and mobility (R26.89);Muscle weakness (generalized) (M62.81)                Time: 9147-8295 OT Time Calculation (min): 22 min Charges:  OT General Charges $OT Visit: 1 Visit OT Evaluation $OT Eval Low Complexity: 1 Low  Zelpha Messing K, OTD, OTR/L SecureChat Preferred Acute Rehab [phone]   Porche Steinberger K Koonce 06/16/2023, 3:30 PM

## 2023-06-16 NOTE — Progress Notes (Signed)
 [facility]                                                                              Jacob Erickson, is a 68 y.o. male, DOB - 05-03-1955, FTD:[id number] Admit date - 06/15/2023    Outpatient Primary MD for the patient is Clelia Croft Netta Corrigan., MD  LOS - 0  days  Chief Complaint  Patient presents with   Abdominal Pain   Nausea       Brief summary   Patient is a 68 year old male with sarcoidosis status post bilateral lung transplant in 2002, on immunosuppressants, follows with [facility] transplant team, CAD status post PCI with stent in 2016, HTN, hyperlipidemia, history of Niesen fundoplication, presented with intractable nausea, abdominal pain and diarrhea.  Patient reported abdominal pain and multiple episodes of watery stools for last 3 days.  Also endorsed generalized weakness. In ED, CT abdomen pelvis showed fluid-filled colon representing diarrheal state otherwise no acute abdominal pathology.  Received Ringer lactate 500 cc bolus. In ED, creatinine 1.6, baseline creatinine 1.1-1.2 Patient was admitted for further workup.  Assessment & Plan    Principal Problem:   AKI (acute kidney injury) (HCC) -Baseline creatinine 1.1-1.3, likely worsened due to acute diarrhea, nausea, poor p.o. intake, hypotension.  Presented with creatinine of 1.6 -Placed on IV fluids - advance diet as tolerated  Active Problems:   Intractable nausea, diarrhea secondary to C. difficile colitis -GI pathogen panel negative, C. difficile toxin positive -Placed on fidaxomicin for 10 days given immunosuppressed state and high risk of recurrence -Placed on full liquid diet, advance slowly as tolerated -Continue IV fluid hydration, antiemetics as needed      Sarcoidosis with history of lung transplantation in 2002 -Follows [facility], presented with creatinine of 1.6 -Continue home immunosuppressants, prophylactic regimen per pharmacy  GERD, history of Niesen fundoplication -Continue  Protonix    Essential hypertension -BP currently soft, outpatient on metoprolol 25 mg twice daily, for now will hold  -Resume once BP improves    Hyperlipidemia -Hold statin, Zetia     BPH (benign prostatic hyperplasia) -Resume Flomax    GERD (gastroesophageal reflux disease) -Resumed Protonix  Estimated body mass index is 20.41 kg/m as calculated from the following:   Height as of this encounter: 6\' 2"  (1.88 m).   Weight as of this encounter: 72.1 kg.  Code Status: Full code DVT Prophylaxis:  enoxaparin (LOVENOX) injection 40 mg Start: 06/16/23 1000   Level of Care: Level of care: Telemetry Medical Family Communication: Updated patient's wife at the bedside Disposition Plan:      Remains inpatient appropriate: Once tolerating diet and is improving   Procedures:    Consultants:     Antimicrobials:   Anti-infectives (From admission, onward)    Start     Dose/Rate Route Frequency Ordered Stop   06/16/23 1000  sulfamethoxazole-trimethoprim (BACTRIM) 400-80 MG per tablet 1 tablet        1 tablet Oral Daily 06/16/23 0432     06/16/23 1000  fidaxomicin (DIFICID) tablet 200 mg        200 mg Oral 2 times daily 06/16/23 0741 06/26/23 0959  Medications  enoxaparin (LOVENOX) injection  40 mg Subcutaneous Daily   feeding supplement  237 mL Oral BID BM   fidaxomicin  200 mg Oral BID   latanoprost  2 drop Both Eyes QHS   ondansetron (ZOFRAN) IV  4 mg Intravenous Once   predniSONE  5 mg Oral Q breakfast   sulfamethoxazole-trimethoprim  1 tablet Oral Daily   tacrolimus  4 mg Oral BID   tamsulosin  0.4 mg Oral Daily   timolol  1 drop Both Eyes Daily      Subjective:   Jacob Erickson was seen and examined today.  Feels miserable with nausea and intractable diarrhea.  No fevers. + Abdominal pain.  Patient denies dizziness, chest pain, shortness of breath, new weakness, numbess, tingling. No acute events overnight.    Objective:   Vitals:   06/16/23 0400  06/16/23 0431 06/16/23 0500 06/16/23 0823  BP: 121/68 137/67 126/67 135/70  Pulse: 90 92 93 90  Resp: (!) 21 (!) 27 (!) 21 18  Temp:    98.8 F (37.1 C)  TempSrc:    Oral  SpO2: 100% 100% 100% 100%  Weight:      Height:        Intake/Output Summary (Last 24 hours) at 06/16/2023 1338 Last data filed at 06/16/2023 1220 Gross per 24 hour  Intake 360 ml  Output 2 ml  Net 358 ml     Wt Readings from Last 3 Encounters:  06/15/23 72.1 kg  04/30/22 76.2 kg  12/16/21 77.1 kg     Exam General: Alert and oriented x 3, NAD Cardiovascular: S1 S2 auscultated,  RRR Respiratory: Clear to auscultation bilaterally, no wheezing, rales or rhonchi Gastrointestinal: Soft, diffuse TTP, ND, NBS Ext: no pedal edema bilaterally Neuro: no new deficits Psych: anxious    Data Reviewed:  I have personally reviewed following labs    CBC Lab Results  Component Value Date   WBC 5.1 06/16/2023   RBC 3.54 (L) 06/16/2023   HGB 12.5 (L) 06/16/2023   HCT 36.6 (L) 06/16/2023   MCV 103.4 (H) 06/16/2023   MCH 35.3 (H) 06/16/2023   PLT 134 (L) 06/16/2023   MCHC 34.2 06/16/2023   RDW 12.9 06/16/2023   LYMPHSABS 1.1 06/15/2023   MONOABS 0.3 06/15/2023   EOSABS 0.2 06/15/2023   BASOSABS 0.0 06/15/2023     Last metabolic panel Lab Results  Component Value Date   NA 138 06/16/2023   K 4.2 06/16/2023   CL 108 06/16/2023   CO2 20 (L) 06/15/2023   BUN 25 (H) 06/16/2023   CREATININE 1.53 (H) 06/16/2023   GLUCOSE 117 (H) 06/16/2023   GFRNONAA 49 (L) 06/16/2023   GFRAA 51 (L) 06/08/2019   CALCIUM 9.0 06/15/2023   PROT 8.1 06/15/2023   ALBUMIN 4.0 06/15/2023   BILITOT 0.9 06/15/2023   ALKPHOS 71 06/15/2023   AST 26 06/15/2023   ALT 20 06/15/2023   ANIONGAP 11 06/15/2023    CBG (last 3)  No results for input(s): "GLUCAP" in the last 72 hours.    Coagulation Profile: No results for input(s): "INR", "PROTIME" in the last 168 hours.   Radiology Studies: I have personally reviewed the  imaging studies  CT ABDOMEN PELVIS W CONTRAST Result Date: 06/16/2023 CLINICAL DATA:  Acute abdominal pain with nausea, initial encounter EXAM: CT ABDOMEN AND PELVIS WITH CONTRAST TECHNIQUE: Multidetector CT imaging of the abdomen and pelvis was performed using the standard protocol following bolus administration of intravenous contrast. RADIATION DOSE REDUCTION: This  exam was performed according to the departmental dose-optimization program which includes automated exposure control, adjustment of the mA and/or kV according to patient size and/or use of iterative reconstruction technique. CONTRAST:  75mL OMNIPAQUE IOHEXOL 350 MG/ML SOLN COMPARISON:  05/26/2023 FINDINGS: Lower chest: Mild scarring is noted in the left base. Hepatobiliary: No focal liver abnormality is seen. No gallstones, gallbladder wall thickening, or biliary dilatation. Pancreas: Unremarkable. No pancreatic ductal dilatation or surrounding inflammatory changes. Spleen: Normal in size without focal abnormality. Adrenals/Urinary Tract: Adrenal glands are within normal limits. Kidneys demonstrate a normal enhancement pattern bilaterally. Stable left renal cyst is noted. No follow-up is recommended. No obstructive changes are seen. The bladder is decompressed. Stomach/Bowel: The sigmoid and rectum are again filled with fluid although no true obstructive changes are seen. The more proximal colon demonstrates fluid likely related to a diarrheal state. The appendix is not well visualized and may have been surgically removed. Surgical clips are noted about the gastroesophageal junction consistent with the given clinical history of fundoplication. Small bowel appears within normal limits. Appendix is not well seen. Vascular/Lymphatic: No significant vascular findings are present. No enlarged abdominal or pelvic lymph nodes. Reproductive: Prostate is unremarkable. Other: No abdominal wall hernia or abnormality. No abdominopelvic ascites. Musculoskeletal:  No acute or significant osseous findings. IMPRESSION: Fluid-filled colon similar to that seen on the prior exam which may represent a diarrheal state. No other acute abnormality is noted. Electronically Signed   By: Alcide Clever M.D.   On: 06/16/2023 01:50       Linzie Boursiquot M.D. [facility] 06/16/2023, 1:38 PM  Available via Epic secure chat 7am-7pm After 7 pm, please refer to night coverage provider listed on amion.

## 2023-06-17 ENCOUNTER — Other Ambulatory Visit (HOSPITAL_COMMUNITY): Payer: Self-pay

## 2023-06-17 DIAGNOSIS — N179 Acute kidney failure, unspecified: Secondary | ICD-10-CM | POA: Diagnosis not present

## 2023-06-17 DIAGNOSIS — E860 Dehydration: Secondary | ICD-10-CM | POA: Diagnosis not present

## 2023-06-17 DIAGNOSIS — A0472 Enterocolitis due to Clostridium difficile, not specified as recurrent: Secondary | ICD-10-CM | POA: Diagnosis not present

## 2023-06-17 DIAGNOSIS — N400 Enlarged prostate without lower urinary tract symptoms: Secondary | ICD-10-CM | POA: Diagnosis not present

## 2023-06-17 LAB — BASIC METABOLIC PANEL WITH GFR
Anion gap: 10 (ref 5–15)
BUN: 19 mg/dL (ref 8–23)
CO2: 19 mmol/L — ABNORMAL LOW (ref 22–32)
Calcium: 8.5 mg/dL — ABNORMAL LOW (ref 8.9–10.3)
Chloride: 106 mmol/L (ref 98–111)
Creatinine, Ser: 1.42 mg/dL — ABNORMAL HIGH (ref 0.61–1.24)
GFR, Estimated: 53 mL/min — ABNORMAL LOW (ref >60)
Glucose, Bld: 118 mg/dL — ABNORMAL HIGH (ref 70–99)
Potassium: 4 mmol/L (ref 3.5–5.1)
Sodium: 135 mmol/L (ref 135–145)

## 2023-06-17 LAB — CBC
HCT: 28.5 % — ABNORMAL LOW (ref 39.0–52.0)
Hemoglobin: 10.1 g/dL — ABNORMAL LOW (ref 13.0–17.0)
MCH: 36.1 pg — ABNORMAL HIGH (ref 26.0–34.0)
MCHC: 35.4 g/dL (ref 30.0–36.0)
MCV: 101.8 fL — ABNORMAL HIGH (ref 80.0–100.0)
Platelets: 114 10*3/uL — ABNORMAL LOW (ref 150–400)
RBC: 2.8 MIL/uL — ABNORMAL LOW (ref 4.22–5.81)
RDW: 12.9 % (ref 11.5–15.5)
WBC: 5 10*3/uL (ref 4.0–10.5)
nRBC: 0 % (ref 0.0–0.2)

## 2023-06-17 LAB — RENAL FUNCTION PANEL
Albumin: 2.6 g/dL — ABNORMAL LOW (ref 3.5–5.0)
Anion gap: 6 (ref 5–15)
BUN: 19 mg/dL (ref 8–23)
CO2: 21 mmol/L — ABNORMAL LOW (ref 22–32)
Calcium: 7.9 mg/dL — ABNORMAL LOW (ref 8.9–10.3)
Chloride: 109 mmol/L (ref 98–111)
Creatinine, Ser: 1.41 mg/dL — ABNORMAL HIGH (ref 0.61–1.24)
GFR, Estimated: 54 mL/min — ABNORMAL LOW (ref >60)
Glucose, Bld: 83 mg/dL (ref 70–99)
Phosphorus: 2.4 mg/dL — ABNORMAL LOW (ref 2.5–4.6)
Potassium: 3.8 mmol/L (ref 3.5–5.1)
Sodium: 136 mmol/L (ref 135–145)

## 2023-06-17 MED ORDER — LACTATED RINGERS IV BOLUS
500.0000 mL | Freq: Once | INTRAVENOUS | Status: AC
  Administered 2023-06-17: 500 mL via INTRAVENOUS

## 2023-06-17 MED ORDER — ONDANSETRON 4 MG PO TBDP
4.0000 mg | ORAL_TABLET | Freq: Three times a day (TID) | ORAL | 0 refills | Status: AC | PRN
  Filled 2023-06-17: qty 20, 7d supply, fill #0

## 2023-06-17 MED ORDER — FIDAXOMICIN 200 MG PO TABS
200.0000 mg | ORAL_TABLET | Freq: Two times a day (BID) | ORAL | 0 refills | Status: AC
  Filled 2023-06-17: qty 18, 9d supply, fill #0

## 2023-06-17 NOTE — Plan of Care (Signed)

## 2023-06-17 NOTE — Discharge Summary (Signed)
 Physician Discharge Summary   Patient: Jacob Erickson MRN: [id number] DOB: [birth year]  Admit date:     06/15/2023  Discharge date: 06/17/23  Discharge Physician: Thad Ranger, MD    PCP: Cleatis Polka., MD   Recommendations at discharge:   Continue Dificid to complete full course of 10 days  Discharge Diagnoses:    Acute C. difficile colitis   AKI (acute kidney injury) (HCC)   Dehydration   Intractable nausea   Sarcoidosis   Diarrhea   History of lung transplant [facility])   Essential hypertension   Hyperlipidemia   BPH (benign prostatic hyperplasia)   GERD (gastroesophageal reflux disease)    Hospital Course:  Patient is a 68 year old male with sarcoidosis status post bilateral lung transplant in 2002, on immunosuppressants, follows with [facility] transplant team, CAD status post PCI with stent in 2016, HTN, hyperlipidemia, history of Niesen fundoplication, presented with intractable nausea, abdominal pain and diarrhea.  Patient reported abdominal pain and multiple episodes of watery stools for last 3 days.  Also endorsed generalized weakness. In ED, CT abdomen pelvis showed fluid-filled colon representing diarrheal state otherwise no acute abdominal pathology.  Received Ringer lactate 500 cc bolus. In ED, creatinine 1.6, baseline creatinine 1.1-1.2 Patient was admitted for further workup.   Assessment and Plan:  AKI (acute kidney injury) (HCC) -Baseline creatinine 1.1-1.3, likely worsened due to acute diarrhea, nausea, poor p.o. intake, hypotension.  Presented with creatinine of 1.6 -Received IV fluid hydration, creatinine this morning 1.4 -Repeat BMET pending   Active Problems:   Intractable nausea, diarrhea secondary to C. difficile colitis -GI pathogen panel negative, C. difficile toxin positive -Placed on fidaxomicin for 10 days given immunosuppressed state and high risk of recurrence -Received IV fluid hydration, diet advanced to soft solids          Sarcoidosis with history of lung transplantation in 2002 -Follows [facility], presented with creatinine of 1.6 -Continue home immunosuppressants, prophylactic regimen per pharmacy   GERD, history of Niesen fundoplication -Continue Protonix     Essential hypertension -BP now stable, resume metoprolol      Hyperlipidemia -Continue statin, Zetia       BPH (benign prostatic hyperplasia) -Resume Flomax     GERD (gastroesophageal reflux disease) -Resumed Protonix   Estimated body mass index is 20.41 kg/m as calculated from the following:   Height as of this encounter: 6\' 2"  (1.88 m).   Weight as of this encounter: 72.1 kg.       Pain control - Jacob Erickson Controlled Substance Reporting System database was reviewed. and patient was instructed, not to drive, operate heavy machinery, perform activities at heights, swimming or participation in water activities or provide baby-sitting services while on Pain, Sleep and Anxiety Medications; until their outpatient Physician has advised to do so again. Also recommended to not to take more than prescribed Pain, Sleep and Anxiety Medications.  Consultants: None Procedures performed: None Disposition: Home Diet recommendation: Soft diet  DISCHARGE MEDICATION: Allergies as of 06/17/2023       Reactions   Dextromethorphan-guaifenesin Diarrhea, Nausea Only, Nausea And Vomiting        Medication List     TAKE these medications    ALPRAZolam 0.5 MG tablet Commonly known as: XANAX Take 0.25-0.5 mg by mouth every 8 (eight) hours as needed.   atorvastatin 20 MG tablet Commonly known as: LIPITOR Take 20 mg by mouth daily.   azithromycin 250 MG tablet Commonly known as: ZITHROMAX Take 250 mg by mouth every Monday,  Wednesday, and Friday.   calcium-vitamin D 250-125 MG-UNIT tablet Commonly known as: OSCAL WITH D Take 2 tablets by mouth 2 (two) times daily.   ezetimibe 10 MG tablet Commonly known as: ZETIA Take 10 mg by mouth  daily.   ferrous sulfate 325 (65 FE) MG tablet Take 325 mg by mouth 2 (two) times daily with a meal.   fidaxomicin 200 MG Tabs tablet Commonly known as: DIFICID Take 1 tablet (200 mg total) by mouth 2 (two) times daily for 9 days.   ibandronate 150 MG tablet Commonly known as: BONIVA Take 150 mg by mouth every 30 (thirty) days. Take in the morning with a full glass of water, on an empty stomach, and do not take anything else by mouth or lie down for the next 30 min.   latanoprost 0.005 % ophthalmic solution Commonly known as: XALATAN Place 2 drops into both eyes at bedtime.   magnesium oxide 400 MG tablet Commonly known as: MAG-OX Take 800 mg by mouth 2 (two) times daily. Take 2 tablets (800 mg total) by mouth 2 (two) times daily at lunch and dinner   methocarbamol 500 MG tablet Commonly known as: ROBAXIN Take 500 mg by mouth 2 (two) times daily as needed for muscle spasms.   metoprolol tartrate 25 MG tablet Commonly known as: LOPRESSOR Take 25 mg by mouth 2 (two) times daily.   multivitamin with minerals Tabs tablet Take 1 tablet by mouth daily.   ondansetron 4 MG disintegrating tablet Commonly known as: ZOFRAN-ODT Take 1 tablet (4 mg total) by mouth every 8 (eight) hours as needed for nausea or vomiting.   pantoprazole 40 MG tablet Commonly known as: PROTONIX Take 40 mg by mouth daily.   predniSONE 5 MG tablet Commonly known as: DELTASONE Take 5 mg by mouth daily with breakfast.   PRESERVISION AREDS 2 PO Take 1 capsule by mouth in the morning and at bedtime.   sulfamethoxazole-trimethoprim 400-80 MG tablet Commonly known as: BACTRIM Take 1 tablet by mouth daily.   tacrolimus 1 MG capsule Commonly known as: PROGRAF Take 4 mg by mouth 2 (two) times daily.   tamsulosin 0.4 MG Caps capsule Commonly known as: FLOMAX Take 0.4 mg by mouth daily.   timolol 0.25 % ophthalmic solution Commonly known as: TIMOPTIC Place 1 drop into both eyes daily.   Zarxio 300  MCG/0.5ML Sosy injection Generic drug: [id number]        Follow-up Information     Cleatis Polka., MD. Schedule an appointment as soon as possible for a visit in 2 week(s).   Specialty: Internal Medicine Why: for hospital follow-up Contact information: [address] [place] [address] [phone]                Discharge Exam: Jacob Erickson Weights   06/15/23 2334  Weight: 72.1 kg   S: Feels much better today, diarrhea has significantly improved, no fever chills, nausea or abdominal pain.  This morning ate his favorite grits and the breakfast.  Diet advanced.  Feels well enough to go home today.  BP 131/66 (BP Location: Right Arm)   Pulse 81   Temp 97.6 F (36.4 C) (Oral)   Resp 18   Ht 6\' 2"  (1.88 m)   Wt 72.1 kg   SpO2 100%   BMI 20.41 kg/m   Physical Exam General: Alert and oriented x 3, NAD Cardiovascular: S1 S2 clear, RRR.  Respiratory: CTAB, no wheezing, rales or rhonchi Gastrointestinal: Soft, nontender, nondistended, NBS Ext:  no pedal edema bilaterally Neuro: no new deficits Skin: No rashes Psych: Normal affect     Condition at discharge: fair  The results of significant diagnostics from this hospitalization (including imaging, microbiology, ancillary and laboratory) are listed below for reference.   Imaging Studies: CT ABDOMEN PELVIS W CONTRAST Result Date: 06/16/2023 CLINICAL DATA:  Acute abdominal pain with nausea, initial encounter EXAM: CT ABDOMEN AND PELVIS WITH CONTRAST TECHNIQUE: Multidetector CT imaging of the abdomen and pelvis was performed using the standard protocol following bolus administration of intravenous contrast. RADIATION DOSE REDUCTION: This exam was performed according to the departmental dose-optimization program which includes automated exposure control, adjustment of the mA and/or kV according to patient size and/or use of iterative reconstruction technique. CONTRAST:  75mL OMNIPAQUE IOHEXOL 350 MG/ML SOLN  COMPARISON:  05/26/2023 FINDINGS: Lower chest: Mild scarring is noted in the left base. Hepatobiliary: No focal liver abnormality is seen. No gallstones, gallbladder wall thickening, or biliary dilatation. Pancreas: Unremarkable. No pancreatic ductal dilatation or surrounding inflammatory changes. Spleen: Normal in size without focal abnormality. Adrenals/Urinary Tract: Adrenal glands are within normal limits. Kidneys demonstrate a normal enhancement pattern bilaterally. Stable left renal cyst is noted. No follow-up is recommended. No obstructive changes are seen. The bladder is decompressed. Stomach/Bowel: The sigmoid and rectum are again filled with fluid although no true obstructive changes are seen. The more proximal colon demonstrates fluid likely related to a diarrheal state. The appendix is not well visualized and may have been surgically removed. Surgical clips are noted about the gastroesophageal junction consistent with the given clinical history of fundoplication. Small bowel appears within normal limits. Appendix is not well seen. Vascular/Lymphatic: No significant vascular findings are present. No enlarged abdominal or pelvic lymph nodes. Reproductive: Prostate is unremarkable. Other: No abdominal wall hernia or abnormality. No abdominopelvic ascites. Musculoskeletal: No acute or significant osseous findings. IMPRESSION: Fluid-filled colon similar to that seen on the prior exam which may represent a diarrheal state. No other acute abnormality is noted. Electronically Signed   By: Alcide Clever M.D.   On: 06/16/2023 01:50   CT ABDOMEN PELVIS W CONTRAST Result Date: 05/26/2023 CLINICAL DATA:  Acute generalized abdominal pain. EXAM: CT ABDOMEN AND PELVIS WITH CONTRAST TECHNIQUE: Multidetector CT imaging of the abdomen and pelvis was performed using the standard protocol following bolus administration of intravenous contrast. RADIATION DOSE REDUCTION: This exam was performed according to the departmental  dose-optimization program which includes automated exposure control, adjustment of the mA and/or kV according to patient size and/or use of iterative reconstruction technique. CONTRAST:  OMNIPAQUE IOHEXOL 300 MG/ML  SOLN COMPARISON:  None Available. FINDINGS: Lower chest: No acute abnormality. Hepatobiliary: No focal liver abnormality is seen. No gallstones, gallbladder wall thickening, or biliary dilatation. Pancreas: Unremarkable. No pancreatic ductal dilatation or surrounding inflammatory changes. Spleen: Normal in size without focal abnormality. Adrenals/Urinary Tract: Adrenal glands appear normal. Left renal cyst is noted for which no further follow-up is required. No hydronephrosis or renal obstruction is noted. Urinary bladder is unremarkable. Stomach/Bowel: Stomach is unremarkable. No small bowel dilatation is noted. Mildly dilated and fluid-filled sigmoid colon and rectum is noted. Appendix is not clearly visualized. Vascular/Lymphatic: No significant vascular findings are present. No enlarged abdominal or pelvic lymph nodes. Reproductive: Prostate is unremarkable. Other: No ascites or hernia is noted. Musculoskeletal: No acute or significant osseous findings. IMPRESSION: Mildly dilated and fluid-filled sigmoid colon and rectum is noted concerning for possible impaction. No other definite abnormality seen in the abdomen or pelvis. Electronically Signed  By: Lupita Raider M.D.   On: 05/26/2023 15:31    Microbiology: Results for orders placed or performed during the hospital encounter of 06/15/23  Gastrointestinal Panel by PCR , Stool     Status: None   Collection Time: 06/16/23  3:04 AM   Specimen: Stool  Result Value Ref Range Status   Campylobacter species NOT DETECTED NOT DETECTED Final   Plesimonas shigelloides NOT DETECTED NOT DETECTED Final   Salmonella species NOT DETECTED NOT DETECTED Final   Yersinia enterocolitica NOT DETECTED NOT DETECTED Final   Vibrio species NOT DETECTED  NOT DETECTED Final   Vibrio cholerae NOT DETECTED NOT DETECTED Final   Enteroaggregative E coli (EAEC) NOT DETECTED NOT DETECTED Final   Enteropathogenic E coli (EPEC) NOT DETECTED NOT DETECTED Final   Enterotoxigenic E coli (ETEC) NOT DETECTED NOT DETECTED Final   Shiga like toxin producing E coli (STEC) NOT DETECTED NOT DETECTED Final   Shigella/Enteroinvasive E coli (EIEC) NOT DETECTED NOT DETECTED Final   Cryptosporidium NOT DETECTED NOT DETECTED Final   Cyclospora cayetanensis NOT DETECTED NOT DETECTED Final   Entamoeba histolytica NOT DETECTED NOT DETECTED Final   Giardia lamblia NOT DETECTED NOT DETECTED Final   Adenovirus F40/41 NOT DETECTED NOT DETECTED Final   Astrovirus NOT DETECTED NOT DETECTED Final   Norovirus GI/GII NOT DETECTED NOT DETECTED Final   Rotavirus A NOT DETECTED NOT DETECTED Final   Sapovirus (I, II, IV, and V) NOT DETECTED NOT DETECTED Final    Comment: Performed at [facility], [address] [place], [place] [address]  C Difficile Quick Screen w PCR reflex     Status: Abnormal   Collection Time: 06/16/23  3:04 AM   Specimen: Stool  Result Value Ref Range Status   C Diff antigen POSITIVE (A) NEGATIVE Final   C Diff toxin POSITIVE (A) NEGATIVE Final   C Diff interpretation Toxin producing C. difficile detected.  Final    Comment: CRITICAL RESULT CALLED TO, READ BACK BY AND VERIFIED WITH: Heloise Purpura RN 06/16/2023 @ 0640 BY AB Performed at [facility], [address], [place], [place] [address]     Labs: CBC: Recent Labs  Lab 06/15/23 2356 06/16/23 0005 06/16/23 0508 06/17/23 0420  WBC 5.1  --  5.1 5.0  NEUTROABS 3.5  --   --   --   HGB 15.2 15.0 12.5* 10.1*  HCT 44.5 44.0 36.6* 28.5*  MCV 104.7*  --  103.4* 101.8*  PLT 157  --  134* 114*   Basic Metabolic Panel: Recent Labs  Lab 06/15/23 2356 06/16/23 0005 06/16/23 0508 06/17/23 0420  NA 137 138  --  136  K 4.1 4.2  --  3.8  CL 106 108  --  109  CO2 20*  --   --   21*  GLUCOSE 120* 117*  --  83  BUN 24* 25*  --  19  CREATININE 1.58* 1.60* 1.53* 1.41*  CALCIUM 9.0  --   --  7.9*  PHOS  --   --   --  2.4*   Liver Function Tests: Recent Labs  Lab 06/15/23 2356 06/17/23 0420  AST 26  --   ALT 20  --   ALKPHOS 71  --   BILITOT 0.9  --   PROT 8.1  --   ALBUMIN 4.0 2.6*   CBG: No results for input(s): "GLUCAP" in the last 168 hours.  Discharge time spent: greater than 30 minutes.  Signed: Jarmon Javid  Isidoro Donning, MD [facility] 06/17/2023

## 2023-06-17 NOTE — Progress Notes (Signed)
 DISCHARGE NOTE HOME Riese Hellard to be discharged Home per MD order. Floor nurse Discussed prescriptions and follow up appointments with the patient. Prescriptions given to patient; medication list explained in detail. Patient verbalized understanding. Patient picked up TOC meds on the way out.  Skin clean, dry and intact without evidence of skin break down, no evidence of skin tears noted. IV catheter discontinued intact. Site without signs and symptoms of complications. Dressing and pressure applied. Pt denies pain at the site currently. No complaints noted.  Patient free of lines, drains, and wounds.   An After Visit Summary (AVS) was printed and given to the patient. Patient escorted via wheelchair, and discharged home via private auto.  Velia Meyer, RN

## 2023-06-17 NOTE — TOC Transition Note (Signed)
 Transition of Care [facility]) - Discharge Note   Patient Details  Name: Jacob Erickson MRN: [id number] Date of Birth: 1955/12/14  [facility]) CM/SW Contact:  Tom-Johnson, Hershal Coria, RN Phone Number: 06/17/2023, 11:33 AM   Clinical Narrative:     Patient is scheduled for discharge today.  Readmission Risk Assessment done. Outpatient f/u, hospital f/u and discharge instructions on AVS. Prescriptions sent to [facility] pharmacy and patient will receive meds prior discharge. Per pharmacy, patient's Dificid Rx is >$1400. Patient assistance has been completed and drug should be mailed to his house today.  Wife, Angelique Blonder or brother, Stephannie Peters to transport at discharge.  No further TOC needs noted.        Final next level of care: Home/Self Care Barriers to Discharge: Barriers Resolved   Patient Goals and CMS Choice Patient states their goals for this hospitalization and ongoing recovery are:: To return home CMS Medicare.gov Compare Post Acute Care list provided to:: Patient Choice offered to / list presented to : NA      Discharge Placement                Patient to be transferred to facility by: Wife, Angelique Blonder or Brother, [place]. Name of family member notified: [facility] Additional resources added to the After Visit Summary for                  DME Arranged: N/A DME Agency: NA       HH Arranged: NA HH Agency: NA        Social Drivers of Health (SDOH) Interventions SDOH Screenings   Food Insecurity: Food Insecurity Present (06/16/2023)  Housing: Low Risk  (06/16/2023)  Transportation Needs: No Transportation Needs (06/16/2023)  Utilities: Not At Risk (06/16/2023)  Social Connections: Socially Integrated (06/16/2023)  Tobacco Use: Low Risk  (06/15/2023)     Readmission Risk Interventions    06/16/2023    4:57 PM  Readmission Risk Prevention Plan  Transportation Screening Complete  PCP or Specialist Appt within 5-7  Days Complete  Home Care Screening Complete  Medication Review (RN CM) Referral to Pharmacy

## 2023-06-17 NOTE — Progress Notes (Signed)
 Mobility Specialist Progress Note:    06/17/23 1200  Mobility  Activity Ambulated with assistance in hallway  Level of Assistance Standby assist, set-up cues, supervision of patient - no hands on  Assistive Device None  Distance Ambulated (ft) 1000 ft  Activity Response Tolerated well  Mobility Referral Yes  Mobility visit 1 Mobility  Mobility Specialist Start Time (ACUTE ONLY) 1040  Mobility Specialist Stop Time (ACUTE ONLY) 1052  Mobility Specialist Time Calculation (min) (ACUTE ONLY) 12 min   Received pt in chair having no complaints and agreeable to mobility. Pt was asymptomatic throughout ambulation. No dizziness throughout session. Returned to room w/o fault. Left in chair w/ call bell in reach and all needs met.   Thompson Grayer Mobility Specialist  Please contact vis Secure Chat or  Rehab Office [phone]

## 2023-09-12 ENCOUNTER — Other Ambulatory Visit: Payer: Self-pay

## 2023-09-12 ENCOUNTER — Emergency Department (HOSPITAL_COMMUNITY)

## 2023-09-12 ENCOUNTER — Encounter (HOSPITAL_COMMUNITY): Payer: Self-pay

## 2023-09-12 ENCOUNTER — Emergency Department (HOSPITAL_COMMUNITY)
Admission: EM | Admit: 2023-09-12 | Discharge: 2023-09-12 | Disposition: A | Discharge status: Home / Self Care | Attending: Emergency Medicine | Admitting: Emergency Medicine

## 2023-09-12 DIAGNOSIS — A0472 Enterocolitis due to Clostridium difficile, not specified as recurrent: Secondary | ICD-10-CM | POA: Diagnosis not present

## 2023-09-12 DIAGNOSIS — A09 Infectious gastroenteritis and colitis, unspecified: Secondary | ICD-10-CM

## 2023-09-12 DIAGNOSIS — R1033 Periumbilical pain: Secondary | ICD-10-CM | POA: Diagnosis present

## 2023-09-12 LAB — URINALYSIS, ROUTINE W REFLEX MICROSCOPIC
Bilirubin Urine: NEGATIVE
Glucose, UA: NEGATIVE mg/dL
Hgb urine dipstick: NEGATIVE
Ketones, ur: NEGATIVE mg/dL
Leukocytes,Ua: NEGATIVE
Nitrite: NEGATIVE
Protein, ur: NEGATIVE mg/dL
Specific Gravity, Urine: 1.029 (ref 1.005–1.030)
pH: 5 (ref 5.0–8.0)

## 2023-09-12 LAB — CBC
HCT: 38.4 % — ABNORMAL LOW (ref 39.0–52.0)
Hemoglobin: 13.1 g/dL (ref 13.0–17.0)
MCH: 36.6 pg — ABNORMAL HIGH (ref 26.0–34.0)
MCHC: 34.1 g/dL (ref 30.0–36.0)
MCV: 107.3 fL — ABNORMAL HIGH (ref 80.0–100.0)
Platelets: 125 10*3/uL — ABNORMAL LOW (ref 150–400)
RBC: 3.58 MIL/uL — ABNORMAL LOW (ref 4.22–5.81)
RDW: 12.7 % (ref 11.5–15.5)
WBC: 4.4 10*3/uL (ref 4.0–10.5)
nRBC: 0 % (ref 0.0–0.2)

## 2023-09-12 LAB — COMPREHENSIVE METABOLIC PANEL WITH GFR
ALT: 22 U/L (ref 0–44)
AST: 31 U/L (ref 15–41)
Albumin: 3.5 g/dL (ref 3.5–5.0)
Alkaline Phosphatase: 58 U/L (ref 38–126)
Anion gap: 8 (ref 5–15)
BUN: 34 mg/dL — ABNORMAL HIGH (ref 8–23)
CO2: 26 mmol/L (ref 22–32)
Calcium: 8.7 mg/dL — ABNORMAL LOW (ref 8.9–10.3)
Chloride: 102 mmol/L (ref 98–111)
Creatinine, Ser: 1.52 mg/dL — ABNORMAL HIGH (ref 0.61–1.24)
GFR, Estimated: 49 mL/min — ABNORMAL LOW (ref >60)
Glucose, Bld: 121 mg/dL — ABNORMAL HIGH (ref 70–99)
Potassium: 4.7 mmol/L (ref 3.5–5.1)
Sodium: 136 mmol/L (ref 135–145)
Total Bilirubin: 1 mg/dL (ref 0.0–1.2)
Total Protein: 7.1 g/dL (ref 6.5–8.1)

## 2023-09-12 LAB — LIPASE, BLOOD: Lipase: 42 U/L (ref 11–51)

## 2023-09-12 LAB — C DIFFICILE QUICK SCREEN W PCR REFLEX
C Diff antigen: POSITIVE — AB
C Diff interpretation: DETECTED
C Diff toxin: POSITIVE — AB

## 2023-09-12 MED ORDER — DIFICID 200 MG PO TABS: 200.0000 mg | ORAL_TABLET | Freq: Two times a day (BID) | ORAL | 0 refills | Status: AC

## 2023-09-12 MED ORDER — FIDAXOMICIN 200 MG PO TABS
200.0000 mg | ORAL_TABLET | Freq: Once | ORAL | Status: AC
  Administered 2023-09-12: 200 mg via ORAL
  Filled 2023-09-12: qty 1

## 2023-09-12 MED ORDER — MORPHINE SULFATE (PF) 4 MG/ML IV SOLN
4.0000 mg | Freq: Once | INTRAVENOUS | Status: AC
  Administered 2023-09-12: 4 mg via INTRAVENOUS
  Filled 2023-09-12: qty 1

## 2023-09-12 MED ORDER — IOHEXOL 300 MG/ML  SOLN
100.0000 mL | Freq: Once | INTRAMUSCULAR | Status: AC | PRN
  Administered 2023-09-12: 100 mL via INTRAVENOUS

## 2023-09-12 MED ORDER — SODIUM CHLORIDE 0.9 % IV BOLUS
500.0000 mL | Freq: Once | INTRAVENOUS | Status: AC
  Administered 2023-09-12: 500 mL via INTRAVENOUS

## 2023-09-12 NOTE — ED Provider Notes (Signed)
 [place] EMERGENCY DEPARTMENT AT [facility] Provider Note   CSN: [id number] Arrival date & time: 09/12/23  1149     Patient presents with: Abdominal Pain, Diarrhea, and Nausea   Jacob Erickson is a 68 y.o. male.   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 difficile infection (completed Dificid ) presents emergency department with mid abdominal pain associated with nausea/vomiting/diarrhea.  Patient states he ate at [employer] last night, feels like a piece of the fried chicken was bad but finished it anyway.  Following this meal, early this morning he developed mid abdominal cramping, nonbloody emesis/diarrhea.  The symptoms have continued throughout this  afternoon, he continues to have watery diarrhea.  Concerned that this feels similar to his C. difficile infection.  Denies any documented fever.  Prior to Admission medications   Medication Sig Start Date End Date Taking? Authorizing Provider  ALPRAZolam  (XANAX ) 0.5 MG tablet Take 0.25-0.5 mg by mouth every 8 (eight) hours as needed. 05/06/23   [provider]  atorvastatin (LIPITOR) 20 MG tablet Take 20 mg by mouth daily.    [provider]  azithromycin (ZITHROMAX) 250 MG tablet Take 250 mg by mouth every Monday, Wednesday, and Friday.    [provider]  calcium-vitamin D (OSCAL WITH D) 250-125 MG-UNIT per tablet Take 2 tablets by mouth 2 (two) times daily.    [provider]  ezetimibe (ZETIA) 10 MG tablet Take 10 mg by mouth daily.    [provider]  ferrous sulfate 325 (65 FE) MG tablet Take 325 mg by mouth 2 (two) times daily with a meal.    [provider]  ibandronate (BONIVA) 150 MG tablet Take 150 mg by mouth every 30 (thirty) days. Take in the morning with a full glass of water, on an empty stomach, and do not take anything else by mouth or lie down for the next 30 min.    [provider]  latanoprost   (XALATAN ) 0.005 % ophthalmic solution Place 2 drops into both eyes at bedtime.    [provider]  magnesium oxide (MAG-OX) 400 MG tablet Take 800 mg by mouth 2 (two) times daily. Take 2 tablets (800 mg total) by mouth 2 (two) times daily at lunch and dinner    [provider]  methocarbamol  (ROBAXIN ) 500 MG tablet Take 500 mg by mouth 2 (two) times daily as needed for muscle spasms. 04/20/23   [provider]  metoprolol tartrate (LOPRESSOR) 25 MG tablet Take 25 mg by mouth 2 (two) times daily.    [provider]  Multiple Vitamin (MULTIVITAMIN WITH MINERALS) TABS tablet Take 1 tablet by mouth daily.    [provider]  Multiple Vitamins-Minerals (PRESERVISION AREDS 2 PO) Take 1 capsule by mouth in the morning and at bedtime.    [provider]  ondansetron  (ZOFRAN -ODT) 4 MG disintegrating tablet Take 1 tablet (4 mg total) by mouth every 8 (eight) hours as needed for nausea or vomiting. 06/17/23   [name], Ripudeep K, MD  pantoprazole  (PROTONIX ) 40 MG tablet Take 40 mg by mouth daily. 06/08/23   [provider]  predniSONE  (DELTASONE ) 5 MG tablet Take 5 mg by mouth daily with breakfast.    [provider]  sulfamethoxazole -trimethoprim  (BACTRIM ,SEPTRA ) 400-80 MG tablet Take 1 tablet by mouth daily.    [provider]  tacrolimus  (PROGRAF ) 1 MG capsule Take 4 mg by mouth 2 (two) times daily.    [provider]  tamsulosin  (FLOMAX ) 0.4 MG CAPS capsule Take 0.4 mg by mouth daily. 05/26/23   [provider]  timolol  (TIMOPTIC ) 0.25 % ophthalmic solution Place 1 drop into both eyes daily. 05/07/23   [provider]  ZARXIO 300 MCG/0.5ML SOSY injection  05/06/23   [provider]    Allergies: Dextromethorphan-guaifenesin    Review of Systems  Constitutional:  Positive for fatigue. Negative for fever.  Respiratory:  Negative for shortness of breath.   Cardiovascular:  Negative for chest pain.   Gastrointestinal:  Positive for abdominal pain, diarrhea, nausea and vomiting. Negative for abdominal distention and blood in stool.  Skin:  Negative for rash.  Neurological:  Negative for headaches.    Updated Vital Signs BP 104/61 (BP Location: Right Arm)   Pulse 76   Temp 98 F (36.7 C) (Oral)   Resp 16   SpO2 98%   Physical Exam Vitals and nursing note reviewed.  Constitutional:      General: He is not in acute distress.    Appearance: Normal appearance. He is not ill-appearing.  HENT:     Head: Normocephalic.     Mouth/Throat:     Mouth: Mucous membranes are moist.   Cardiovascular:     Rate and Rhythm: Normal rate.  Pulmonary:     Effort: Pulmonary effort is normal. No respiratory distress.  Abdominal:     General: Bowel sounds are decreased.     Palpations: Abdomen is soft.     Tenderness: There is abdominal tenderness in the periumbilical area. There is no guarding or rebound.   Skin:    General: Skin is warm.   Neurological:     Mental Status: He is alert and oriented to person, place, and time. Mental status is at baseline.   Psychiatric:        Mood and Affect: Mood normal.     (all labs ordered are listed, but only abnormal results are displayed) Labs Reviewed  CBC - Abnormal; Notable for the following components:      Result Value   RBC 3.58 (*)    HCT 38.4 (*)    MCV 107.3 (*)    MCH 36.6 (*)    Platelets 125 (*)    All other components within normal limits  COMPREHENSIVE METABOLIC PANEL WITH GFR - Abnormal; Notable for the following components:   Glucose, Bld 121 (*)    BUN 34 (*)    Creatinine, Ser 1.52 (*)    Calcium 8.7 (*)    GFR, Estimated 49 (*)    All other components within normal limits  C DIFFICILE QUICK SCREEN W PCR REFLEX    URINALYSIS, ROUTINE W REFLEX MICROSCOPIC  LIPASE, BLOOD    EKG: None  Radiology: No results found.   Procedures   Medications Ordered in the ED  sodium chloride  0.9 % bolus 500 mL (has no  administration in time range)  morphine (PF) 4 MG/ML injection 4 mg (4 mg Intravenous Given 09/12/23 1353)  iohexol  (OMNIPAQUE ) 300 MG/ML solution 100 mL (100 mLs Intravenous Contrast Given 09/12/23 1404)                                    Medical Decision Making Amount and/or Complexity of Data Reviewed Labs: ordered. Radiology: ordered.  Risk Prescription drug management.   68 year old male with recent C. difficile infection who completed outpatient oral antibiotics with resolution of  symptoms presents to the emergency department with abdominal pain, nausea/vomiting and watery diarrhea.  States has had close to 10 episodes of diarrhea today.  Nonbloody stools.  Vitals are normal and stable on arrival.  Abdomen is slightly tender to palpation.  Blood work is reassuring, mild dehydration but stable kidney function.  C. difficile screen today is positive.  CT of the abdomen pelvis identifies diarrheal enteritis findings.  Discussed case with infectious disease doctor.  It appears that his stool culture had been negative for C. difficile after this most recent infection.  Given that his symptoms have returned we feel that this C. difficile testing being positive is more acute and not chronic from recent infection.  They recommend that we start him on Dificid  which we will do.  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  This may be contributing to these infections.  He will follow-up with his doctors at [facility] for reevaluation of this.  Patient at this time appears safe and stable for discharge and close outpatient follow up. Discharge plan and strict return to ED precautions discussed, patient verbalizes understanding and agreement.     Final diagnoses:  None    ED Discharge Orders     None          Bari Roxie HERO, DO 09/12/23 1910

## 2023-09-12 NOTE — Discharge Instructions (Signed)
 You have been seen and discharged from the emergency department.  Your C. difficile toxin was positive today.  This seems like a new infection.  We sent off the stool for further testing/culture.  Consulted with infectious disease doctor and the decision is made to restart you on Dificid .  Please call your doctors at [facility] tomorrow for follow-up and reevaluation.  Follow-up with your primary provider for further evaluation and further care. Take home medications as prescribed. If you have any worsening symptoms or further concerns for your health please return to an emergency department for further evaluation.

## 2023-09-12 NOTE — ED Triage Notes (Signed)
 Pt arrives via EMS from home. Pt c/o abdominal pain, nausea, and multiple episodes of diarrhea since 0500 this morning. Pt reports it could be related to the [employer] he ate last night. Pt does report hx of c-diff. Pt is AxOx4.

## 2023-09-13 ENCOUNTER — Other Ambulatory Visit (HOSPITAL_COMMUNITY): Payer: Self-pay

## 2023-09-13 LAB — GASTROINTESTINAL PANEL BY PCR, STOOL (REPLACES STOOL CULTURE)

## 2023-09-13 MED ORDER — DIFICID 200 MG PO TABS
200.0000 mg | ORAL_TABLET | Freq: Two times a day (BID) | ORAL | 0 refills | Status: AC
  Filled 2023-09-13 (×2): qty 20, 10d supply, fill #0

## 2023-09-13 MED ORDER — HYDROCODONE-ACETAMINOPHEN 5-325 MG PO TABS
1.0000 | ORAL_TABLET | Freq: Four times a day (QID) | ORAL | 0 refills | Status: AC | PRN
  Filled 2023-09-13: qty 120, 30d supply, fill #0

## 2023-09-14 LAB — STOOL CULTURE

## 2023-10-05 ENCOUNTER — Encounter (HOSPITAL_BASED_OUTPATIENT_CLINIC_OR_DEPARTMENT_OTHER): Payer: Self-pay | Admitting: Emergency Medicine

## 2023-10-05 ENCOUNTER — Other Ambulatory Visit: Payer: Self-pay

## 2023-10-05 ENCOUNTER — Emergency Department (HOSPITAL_BASED_OUTPATIENT_CLINIC_OR_DEPARTMENT_OTHER)
Admission: EM | Admit: 2023-10-05 | Discharge: 2023-10-05 | Disposition: A | Discharge status: Home / Self Care | Attending: Emergency Medicine | Admitting: Emergency Medicine

## 2023-10-05 DIAGNOSIS — R109 Unspecified abdominal pain: Secondary | ICD-10-CM | POA: Diagnosis present

## 2023-10-05 DIAGNOSIS — M545 Low back pain, unspecified: Secondary | ICD-10-CM | POA: Diagnosis not present

## 2023-10-05 MED ORDER — PREDNISONE 50 MG PO TABS: ORAL_TABLET | ORAL | 0 refills | Status: AC

## 2023-10-05 MED ORDER — DIAZEPAM 2 MG PO TABS: 2.0000 mg | ORAL_TABLET | Freq: Four times a day (QID) | ORAL | 0 refills | Status: AC | PRN

## 2023-10-05 NOTE — ED Provider Notes (Signed)
 [place] EMERGENCY DEPARTMENT AT [facility] Provider Note   CSN: [id number] Arrival date & time: 10/05/23  0944     Patient presents with: Back Pain   Jacob Erickson is a 68 y.o. male.   68 year old presents with bilateral flank pain for over a month.  States that the pain is in his flanks and worse with certain movements.  States that when he first gets up to walk around the pain is worse with and gets better once he gets moving.  Denies any radicular symptoms.  No bowel or bladder dysfunction.  Has been using opiates without relief.  Denies any urinary symptoms.  No rashes noted.  No history of trauma.       Prior to Admission medications   Medication Sig Start Date End Date Taking? Authorizing Provider  ALPRAZolam  (XANAX ) 0.5 MG tablet Take 0.25-0.5 mg by mouth every 8 (eight) hours as needed. 05/06/23   [provider]  atorvastatin (LIPITOR) 20 MG tablet Take 20 mg by mouth daily.    [provider]  azithromycin (ZITHROMAX) 250 MG tablet Take 250 mg by mouth every Monday, Wednesday, and Friday.    [provider]  calcium-vitamin D (OSCAL WITH D) 250-125 MG-UNIT per tablet Take 2 tablets by mouth 2 (two) times daily.    [provider]  ezetimibe (ZETIA) 10 MG tablet Take 10 mg by mouth daily.    [provider]  ferrous sulfate 325 (65 FE) MG tablet Take 325 mg by mouth 2 (two) times daily with a meal.    [provider]  fidaxomicin  (DIFICID ) 200 MG TABS tablet Take 1 tablet (200 mg total) by mouth 2 (two) times daily. 09/12/23   Horton, Roxie HERO, DO  fidaxomicin  (DIFICID ) 200 MG TABS tablet Take 1 tablet (200 mg total) by mouth 2 (two) times daily for 10 days. 09/13/23     HYDROcodone -acetaminophen  (NORCO/VICODIN) 5-325 MG tablet Take 1 tablet by mouth every 6 (six) hours as needed for SEVERE pain for 30 days. 09/13/23     ibandronate (BONIVA) 150 MG tablet Take 150 mg by mouth every 30 (thirty) days. Take  in the morning with a full glass of water, on an empty stomach, and do not take anything else by mouth or lie down for the next 30 min.    [provider]  latanoprost  (XALATAN ) 0.005 % ophthalmic solution Place 2 drops into both eyes at bedtime.    [provider]  magnesium oxide (MAG-OX) 400 MG tablet Take 800 mg by mouth 2 (two) times daily. Take 2 tablets (800 mg total) by mouth 2 (two) times daily at lunch and dinner    [provider]  methocarbamol  (ROBAXIN ) 500 MG tablet Take 500 mg by mouth 2 (two) times daily as needed for muscle spasms. 04/20/23   [provider]  metoprolol tartrate (LOPRESSOR) 25 MG tablet Take 25 mg by mouth 2 (two) times daily.    [provider]  Multiple Vitamin (MULTIVITAMIN WITH MINERALS) TABS tablet Take 1 tablet by mouth daily.    [provider]  Multiple Vitamins-Minerals (PRESERVISION AREDS 2 PO) Take 1 capsule by mouth in the morning and at bedtime.    [provider]  ondansetron  (ZOFRAN -ODT) 4 MG disintegrating tablet Take 1 tablet (4 mg total) by mouth every 8 (eight) hours as needed for nausea or vomiting. 06/17/23   [name], Nydia POUR, MD  pantoprazole  (PROTONIX ) 40 MG tablet Take 40 mg by mouth daily. 06/08/23  [provider]  predniSONE  (DELTASONE ) 5 MG tablet Take 5 mg by mouth daily with breakfast.    [provider]  sulfamethoxazole -trimethoprim  (BACTRIM ,SEPTRA ) 400-80 MG tablet Take 1 tablet by mouth daily.    [provider]  tacrolimus  (PROGRAF ) 1 MG capsule Take 4 mg by mouth 2 (two) times daily.    [provider]  tamsulosin  (FLOMAX ) 0.4 MG CAPS capsule Take 0.4 mg by mouth daily. 05/26/23   [provider]  timolol  (TIMOPTIC ) 0.25 % ophthalmic solution Place 1 drop into both eyes daily. 05/07/23   [provider]  ZARXIO 300 MCG/0.5ML SOSY injection  05/06/23   [provider]    Allergies: Dextromethorphan-guaifenesin     Review of Systems  All other systems reviewed and are negative.   Updated Vital Signs BP (!) 141/80 (BP Location: Right Arm)   Pulse 88   Temp 98 F (36.7 C)   Resp 16   Ht 1.88 m (6' 2)   Wt 72.1 kg   SpO2 100%   BMI 20.41 kg/m   Physical Exam Vitals and nursing note reviewed.  Constitutional:      General: He is not in acute distress.    Appearance: Normal appearance. He is well-developed. He is not toxic-appearing.  HENT:     Head: Normocephalic and atraumatic.  Eyes:     General: Lids are normal.     Conjunctiva/sclera: Conjunctivae normal.     Pupils: Pupils are equal, round, and reactive to light.  Neck:     Thyroid: No thyroid mass.     Trachea: No tracheal deviation.  Cardiovascular:     Rate and Rhythm: Normal rate and regular rhythm.     Heart sounds: Normal heart sounds. No murmur heard.    No gallop.  Pulmonary:     Effort: Pulmonary effort is normal. No respiratory distress.     Breath sounds: Normal breath sounds. No stridor. No decreased breath sounds, wheezing, rhonchi or rales.  Abdominal:     General: There is no distension.     Palpations: Abdomen is soft.     Tenderness: There is no abdominal tenderness. There is no rebound.  Musculoskeletal:        General: Normal range of motion.     Cervical back: Normal range of motion and neck supple.     Lumbar back: Tenderness present. No bony tenderness.       Back:  Skin:    General: Skin is warm and dry.     Findings: No abrasion or rash.  Neurological:     General: No focal deficit present.     Mental Status: He is alert and oriented to person, place, and time. Mental status is at baseline.     GCS: GCS eye subscore is 4. GCS verbal subscore is 5. GCS motor subscore is 6.     Cranial Nerves: No cranial nerve deficit.     Sensory: No sensory deficit.     Motor: Motor function is intact.     Comments: Strength is 5 out of 5 bilateral lower extremities  Psychiatric:        Attention and  Perception: Attention normal.        Speech: Speech normal.        Behavior: Behavior normal.     (all labs ordered are listed, but only abnormal results are displayed) Labs Reviewed - No data to display  EKG: None  Radiology: No results found.   Procedures  Medications Ordered in the ED - No data to display                                  Medical Decision Making  Patient musculoskeletal back pain.  Will add Valium  and increase his dose of prednisone .  He will need to contact [facility] for a tapering dose.     Final diagnoses:  None    ED Discharge Orders     None          Dasie Faden, MD 10/05/23 1040

## 2023-10-05 NOTE — ED Triage Notes (Addendum)
 Pt c/o lower back pain on change of position x2 days, minimal relief from pain meds, last took Vicodin this morning.   Cdif + 3wks ago, on abx.

## 2023-10-05 NOTE — Discharge Instructions (Addendum)
 Call [facility] today and ask them how they want your prednisone  tapered

## 2023-10-05 NOTE — ED Notes (Signed)
 Pt given discharge instructions and reviewed prescriptions. Opportunities given for questions. Pt verbalizes understanding. Jillyn Hidden, RN

## 2024-02-05 IMAGING — CT CT CERVICAL SPINE W/O CM
3 series · 14 of 27 positions shown, 16 images · non-contrast
Comparison: None Available.

CLINICAL DATA: Patient trying to lift a heavy box and hurt his back
being pulled. Persistent neck and low back pain.



[Series 5: c spine soft · axial · 0.43mm/px · z∈[+700,+828]mm · 5 of 113 slices shown]
[im 17/113  soft-tissue]
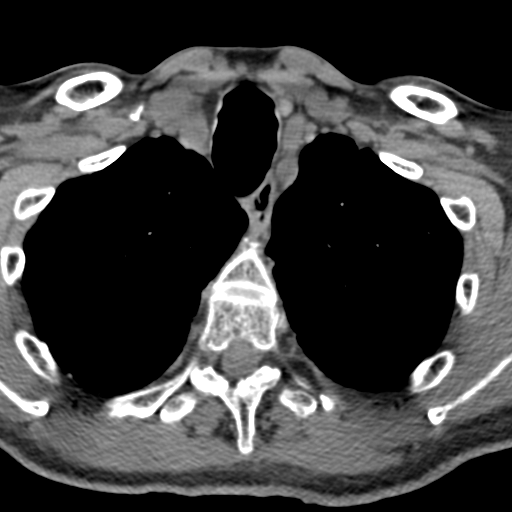
[im 33/113  soft-tissue]
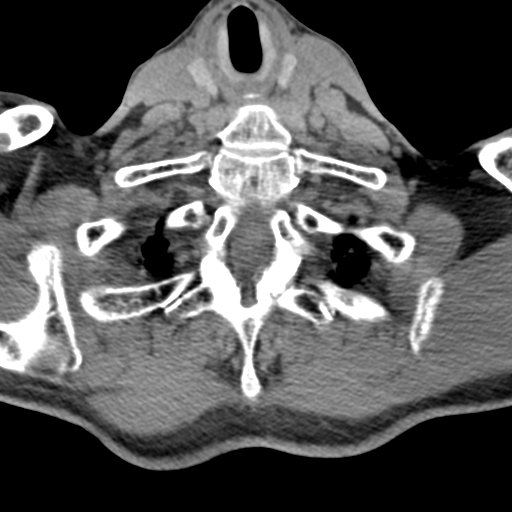
[im 49/113  soft-tissue]
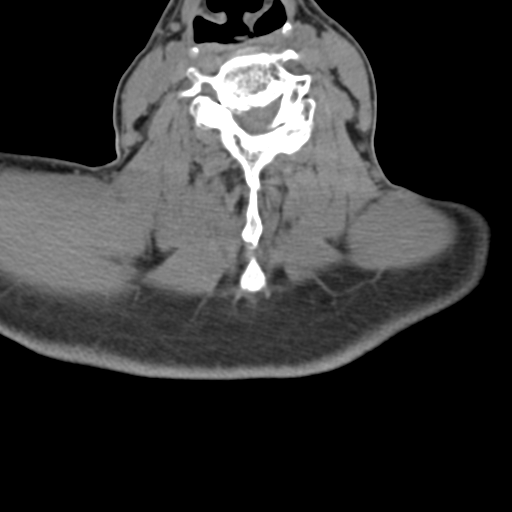
[im 65/113  soft-tissue]
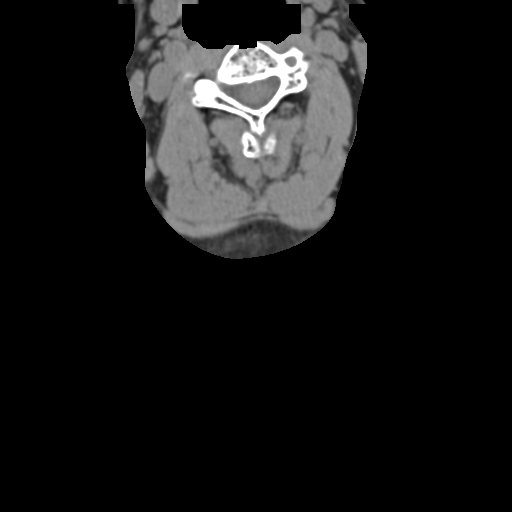
[im 81/113  soft-tissue]
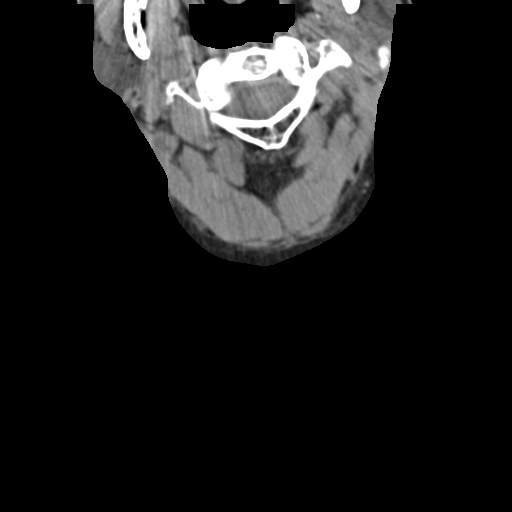

[Series 7: sag bone · sagittal · 0.27mm/px · 5 of 57 slices shown, 6 images]
[im 19/57  bone]
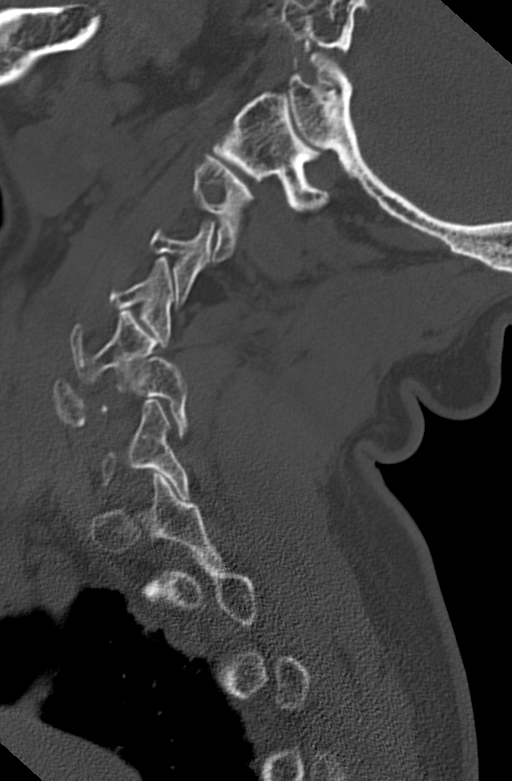
[im 24/57  bone]
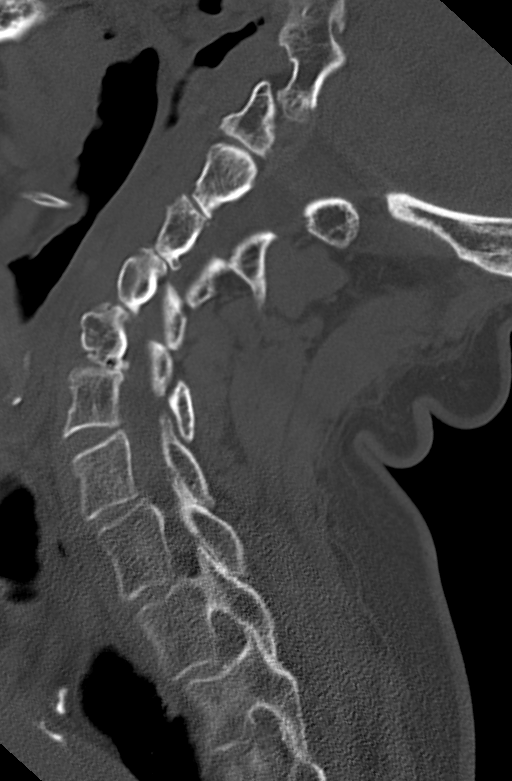
[im 29/57  soft-tissue]
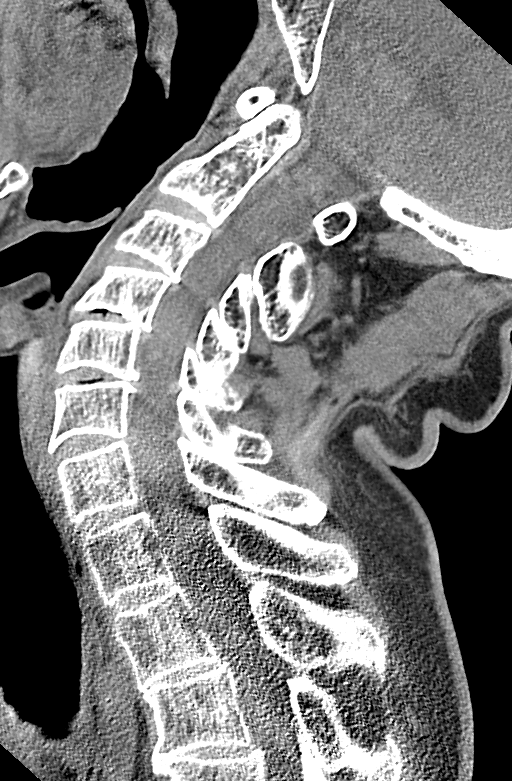
[im 29/57  bone]
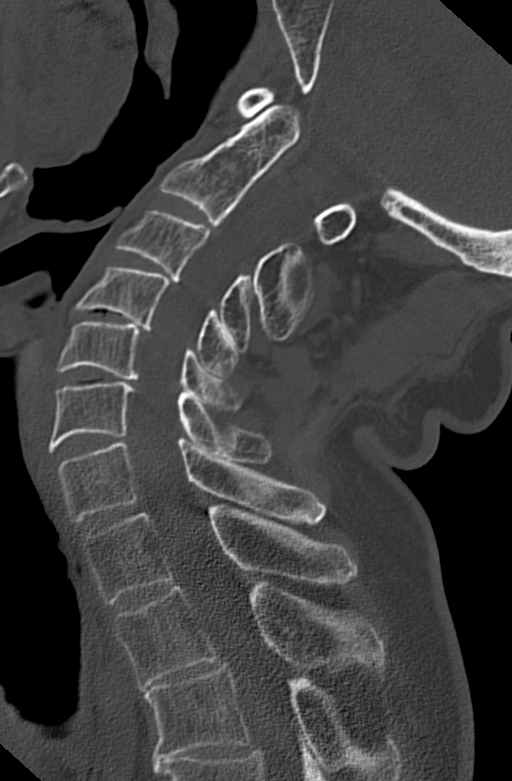
[im 33/57  bone]
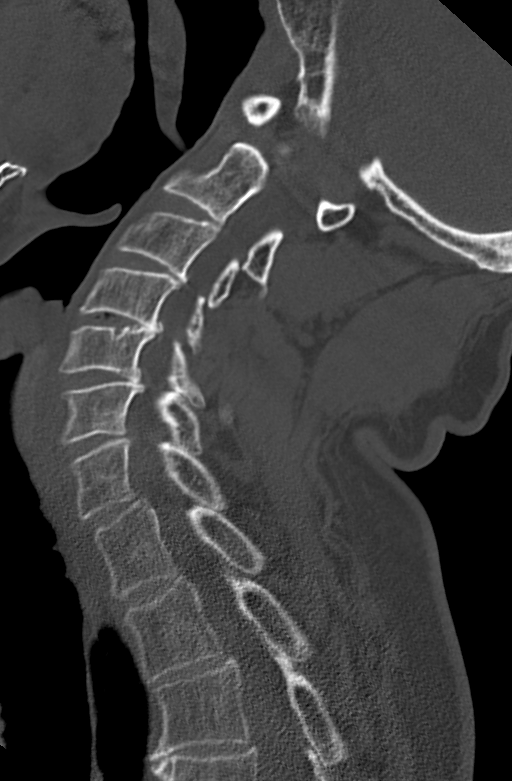
[im 38/57  bone]
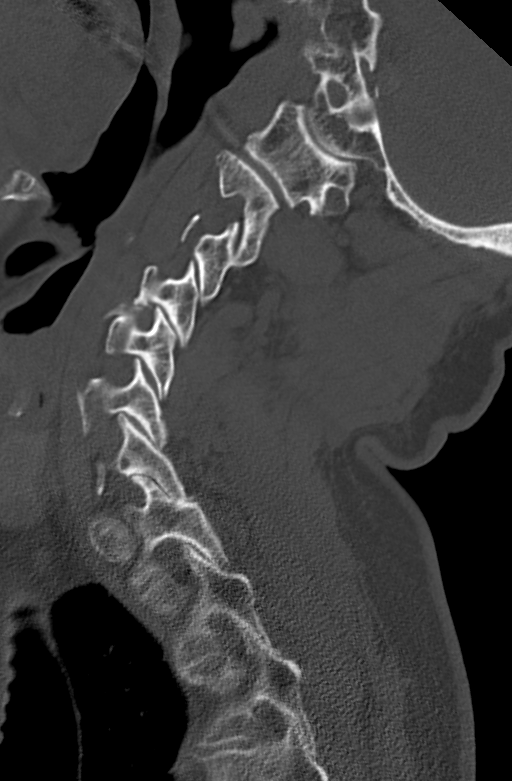

[Series 8: orthogonal axials · axial · 0.25mm/px · z∈[+710,+820]mm · 4 of 82 slices shown, 5 images]
[im 17/82  soft-tissue]
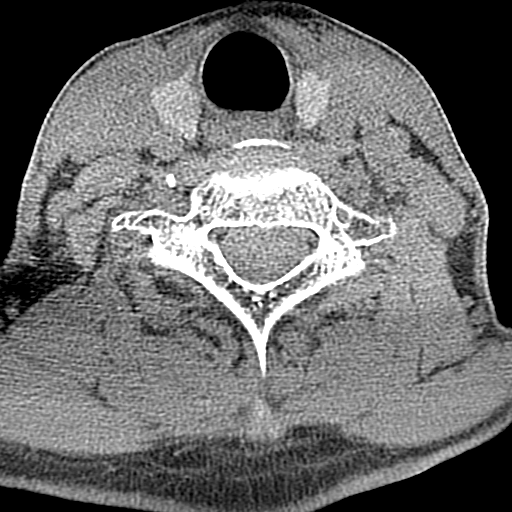
[im 17/82  bone]
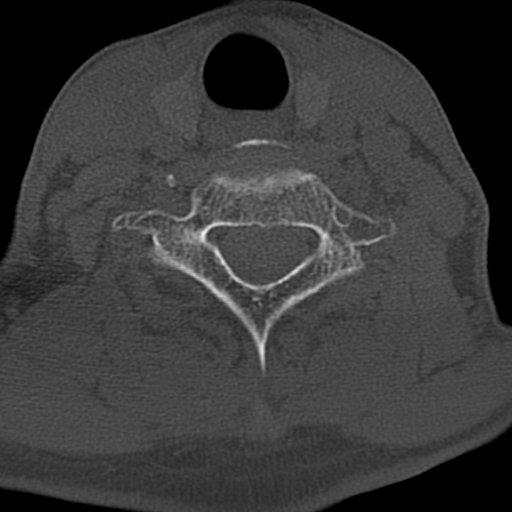
[im 33/82  bone]
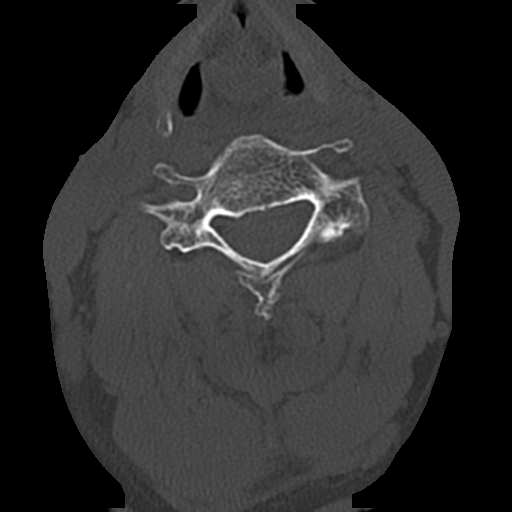
[im 49/82  bone]
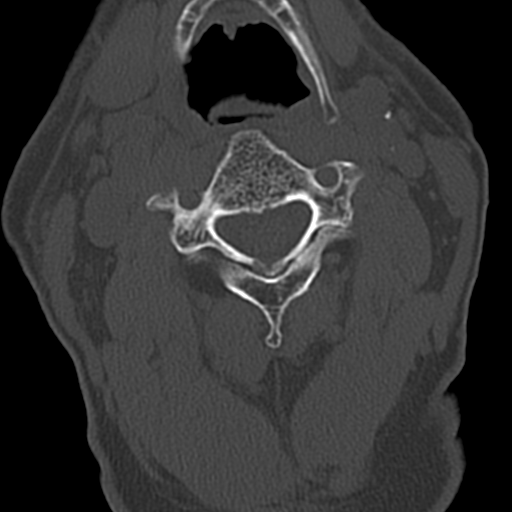
[im 65/82  bone]
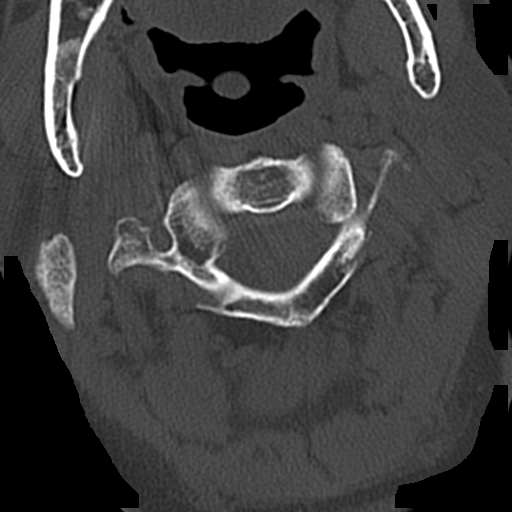

[14 of 27 positions shown; findings below may reference images not displayed]

FINDINGS: Alignment: Normal.

Skull base and vertebrae: No acute fracture. No primary bone lesion
or focal pathologic process. Developmental anomaly involving the
left facet of C6.

Soft tissues and spinal canal: No prevertebral fluid or swelling. No
visible canal hematoma.

Disc levels: Mild loss of disc height at C4-C5 and C5-C6. Mild disc
bulging and endplate spurring. No convincing disc herniation.

Upper chest: No acute or significant abnormality.

Other: None.
IMPRESSION: 1. No fracture or acute finding.
2. Mild disc degenerative changes. No evidence of a disc herniation.

## 2024-02-05 IMAGING — CT CT L SPINE W/O CM
3 series · 10 of 33 positions shown, 12 images · non-contrast
Comparison: Lumbar radiographs 10/06/2010.

CLINICAL DATA: Low back pain, increased fracture risk



[Series 5: l-spine wo soft tissue · axial · 0.32mm/px · z∈[+333,+465]mm · 2 of 143 slices shown, 3 images]
[im 44/143  soft-tissue]
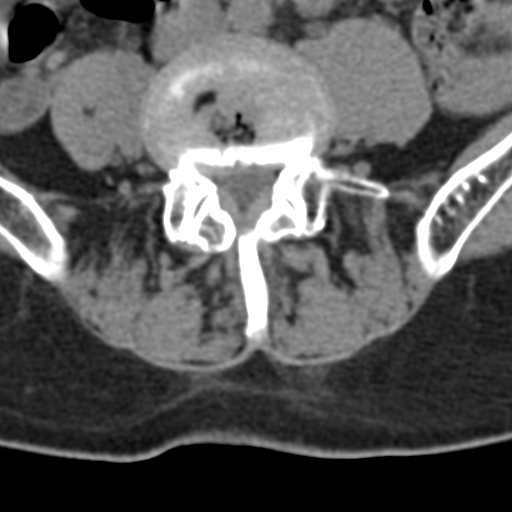
[im 44/143  bone]
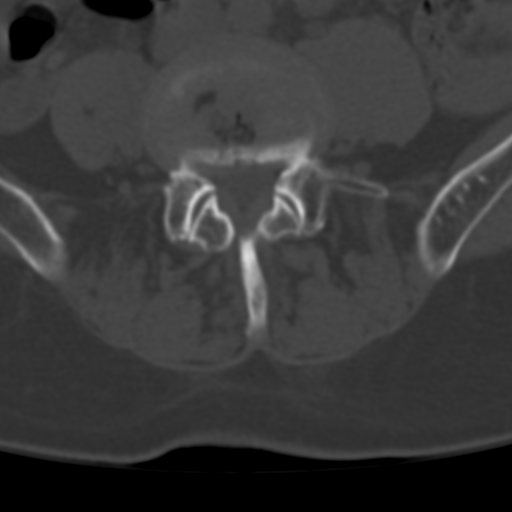
[im 110/143  bone]
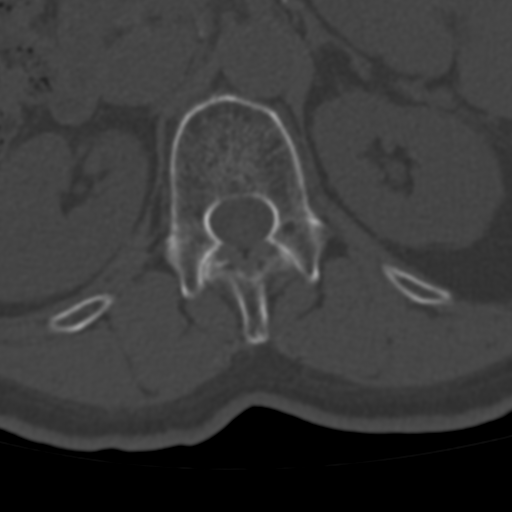

[Series 6: coronal bone · coronal · 0.27mm/px · 3 of 69 slices shown]
[im 14/69  bone]
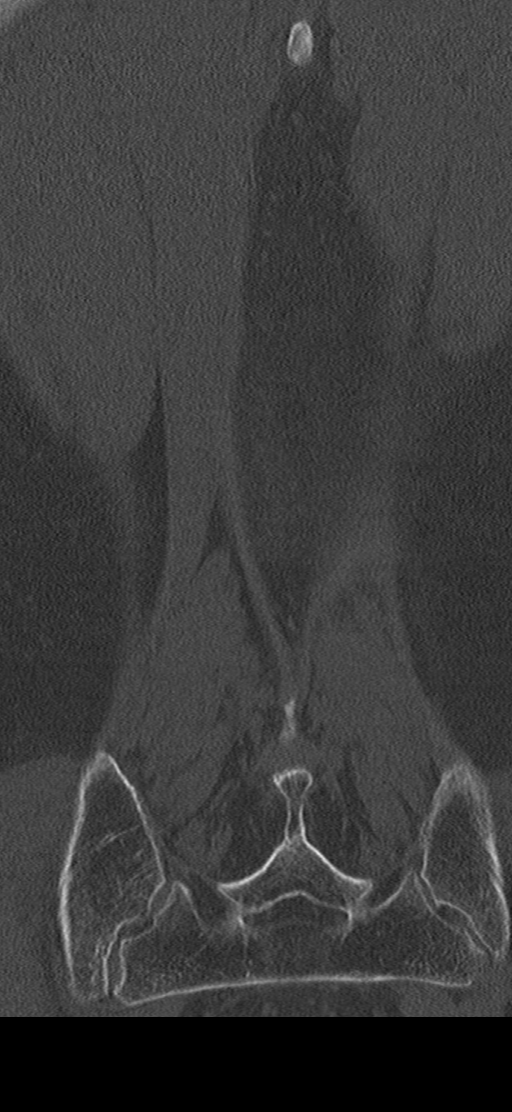
[im 28/69  bone]
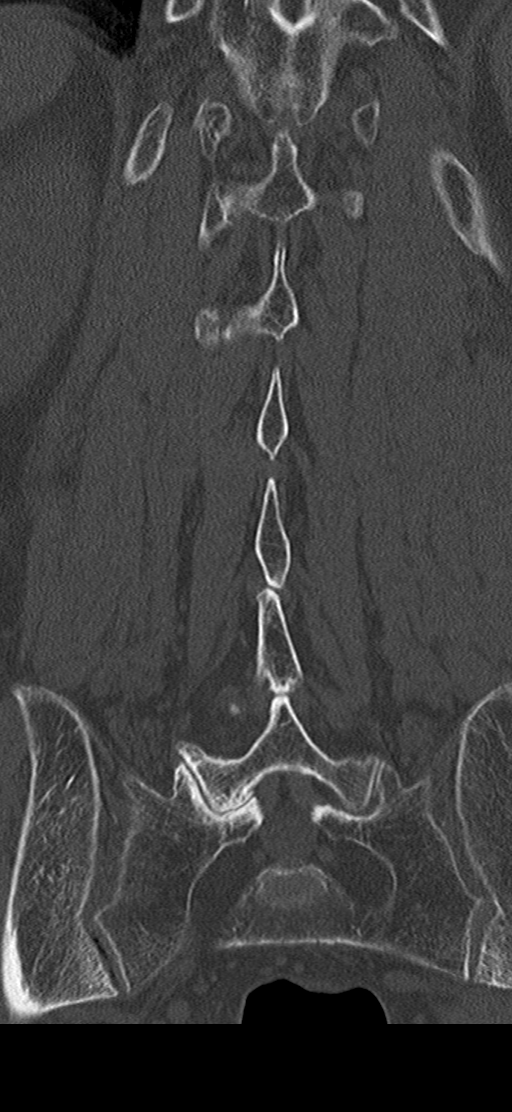
[im 41/69  bone]
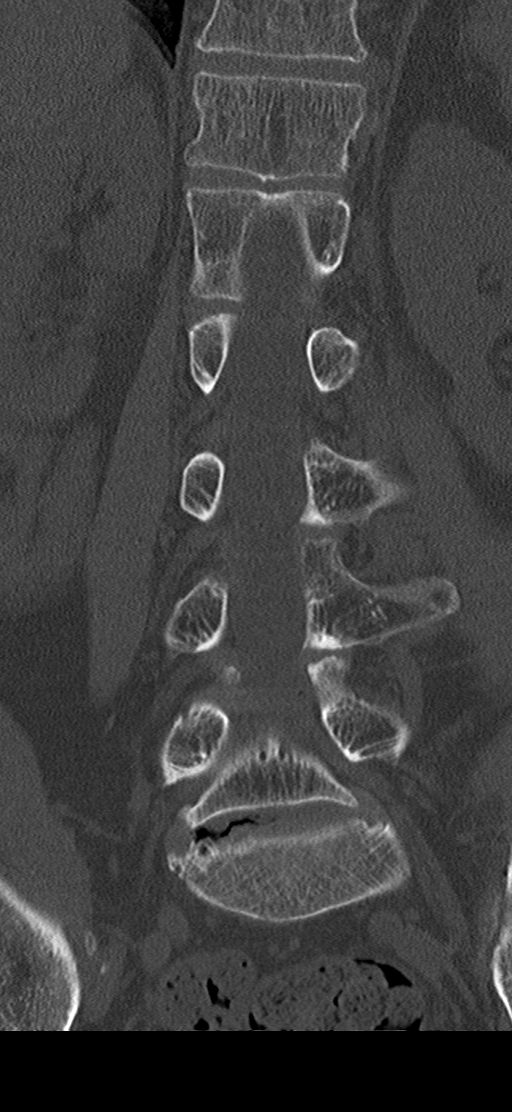

[Series 7: sagittal bone · sagittal · 0.30mm/px · 5 of 61 slices shown, 6 images]
[im 21/61  bone]
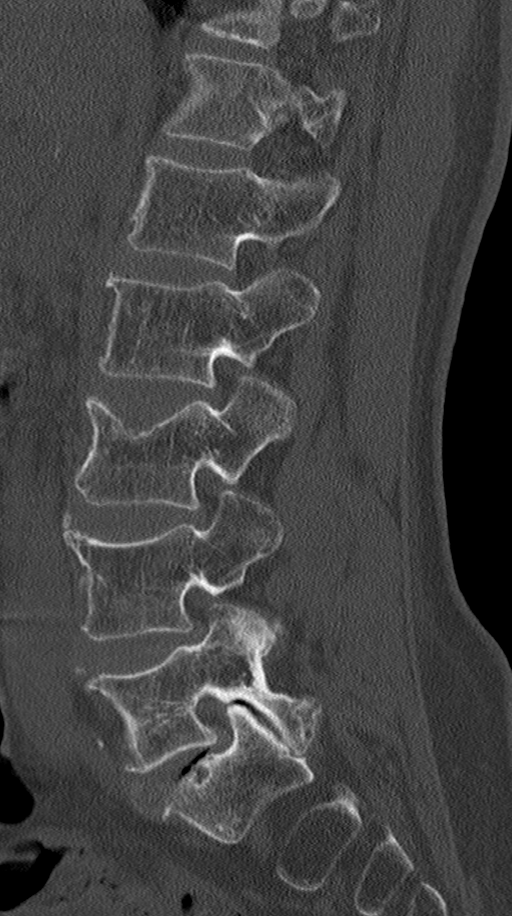
[im 26/61  bone]
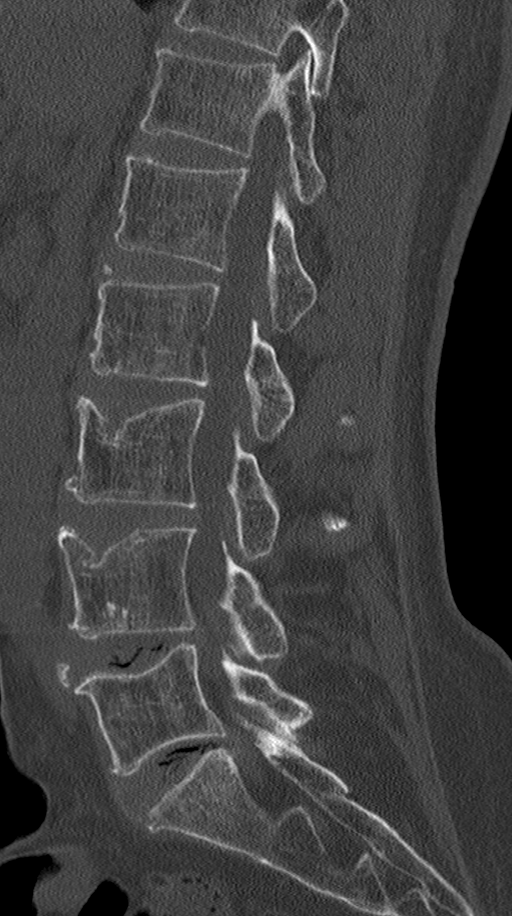
[im 31/61  soft-tissue]
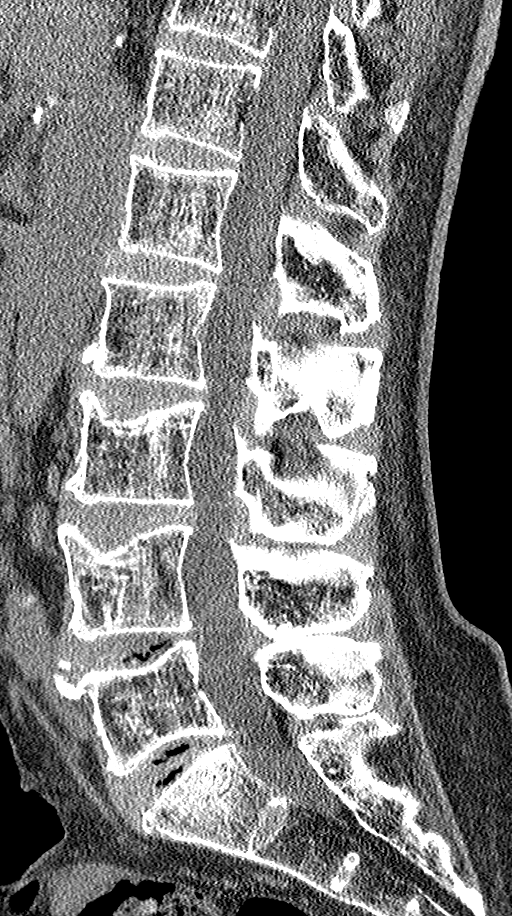
[im 31/61  bone]
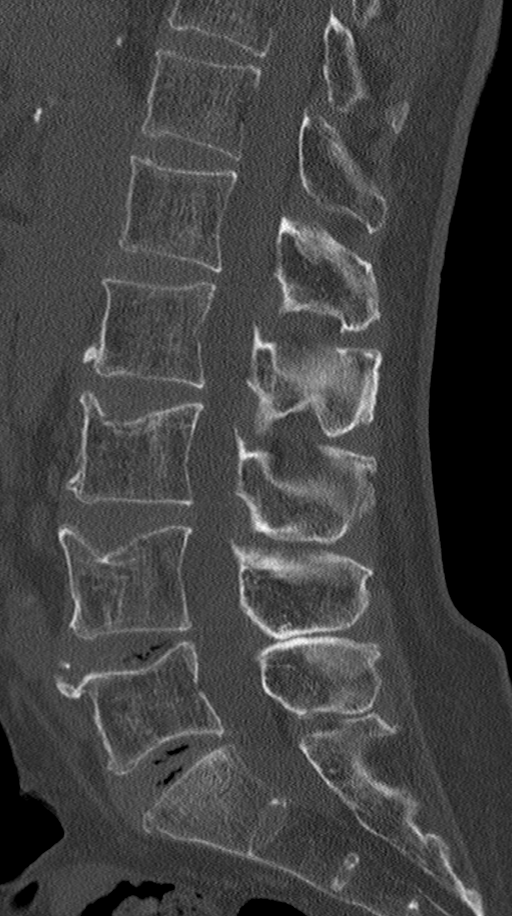
[im 36/61  bone]
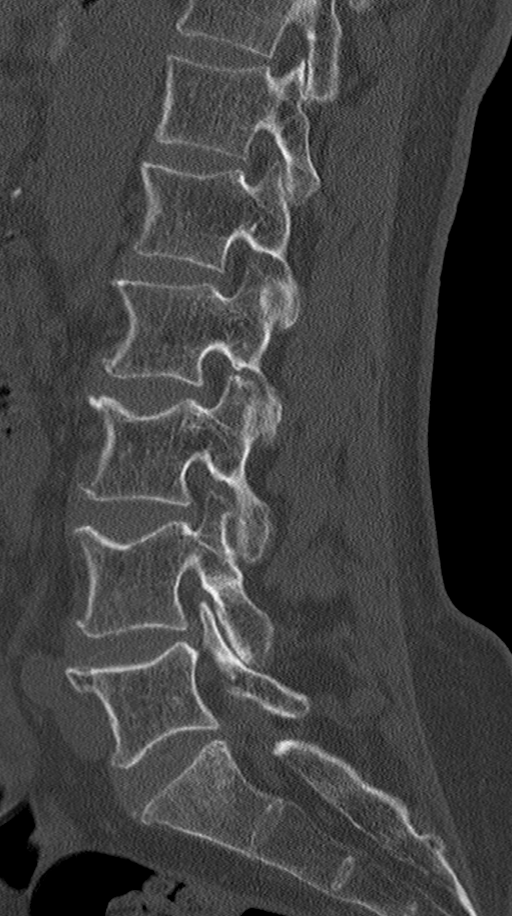
[im 41/61  bone]
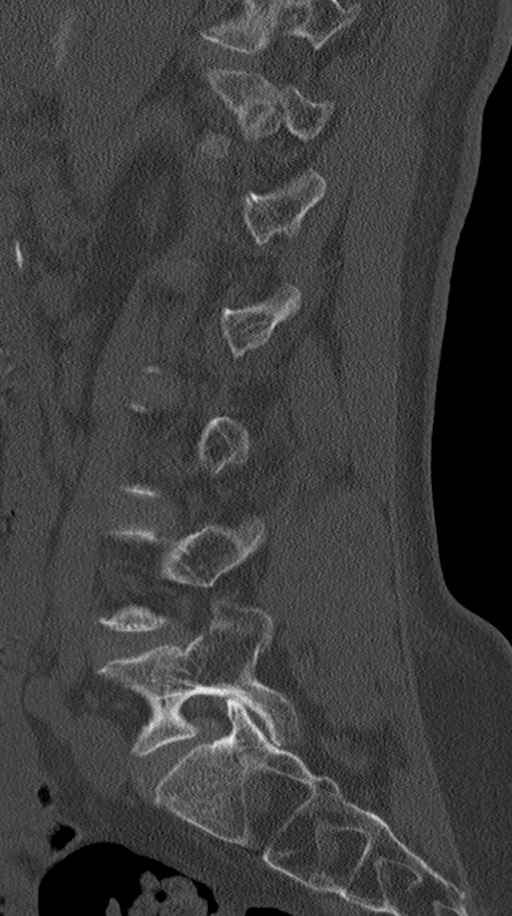

[10 of 33 positions shown; findings below may reference images not displayed]

FINDINGS: Segmentation: 5 non rib-bearing lumbar vertebral bodies.

Alignment: No substantial sagittal subluxation.

Vertebrae: Deformity of the L3 and L4 superior endplates, increased
in conspicuity at L4 and new at L3 since remote 3483 radiographs.
There is no associated trabecular sclerosis or discrete lucency.

Paraspinal and other soft tissues: Negative.

Disc levels: Multilevel degenerative change without evidence of
high-grade bony canal or foraminal stenosis.
IMPRESSION: 1. Deformity of the L3 and L4 superior endplates, which are favored
remote and/or degenerative in etiology but strictly age
indeterminate by CT. An MRI could assess for associated bone marrow
edema if clinically warranted.
2. Osteopenia.
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
## 2024-03-01 ENCOUNTER — Other Ambulatory Visit: Payer: Self-pay

## 2024-03-01 ENCOUNTER — Emergency Department (HOSPITAL_COMMUNITY): Admission: EM | Admit: 2024-03-01 | Discharge: 2024-03-01 | Disposition: A | Discharge status: Home / Self Care

## 2024-03-01 ENCOUNTER — Emergency Department (HOSPITAL_COMMUNITY)

## 2024-03-01 DIAGNOSIS — E875 Hyperkalemia: Secondary | ICD-10-CM | POA: Insufficient documentation

## 2024-03-01 DIAGNOSIS — Z79899 Other long term (current) drug therapy: Secondary | ICD-10-CM | POA: Insufficient documentation

## 2024-03-01 DIAGNOSIS — I2510 Atherosclerotic heart disease of native coronary artery without angina pectoris: Secondary | ICD-10-CM | POA: Insufficient documentation

## 2024-03-01 DIAGNOSIS — X58XXXA Exposure to other specified factors, initial encounter: Secondary | ICD-10-CM | POA: Insufficient documentation

## 2024-03-01 DIAGNOSIS — R9082 White matter disease, unspecified: Secondary | ICD-10-CM | POA: Insufficient documentation

## 2024-03-01 DIAGNOSIS — R7402 Elevation of levels of lactic acid dehydrogenase (LDH): Secondary | ICD-10-CM | POA: Insufficient documentation

## 2024-03-01 DIAGNOSIS — K5903 Drug induced constipation: Secondary | ICD-10-CM | POA: Insufficient documentation

## 2024-03-01 DIAGNOSIS — S32018A Other fracture of first lumbar vertebra, initial encounter for closed fracture: Secondary | ICD-10-CM | POA: Insufficient documentation

## 2024-03-01 DIAGNOSIS — B349 Viral infection, unspecified: Secondary | ICD-10-CM | POA: Insufficient documentation

## 2024-03-01 DIAGNOSIS — S32010A Wedge compression fracture of first lumbar vertebra, initial encounter for closed fracture: Secondary | ICD-10-CM

## 2024-03-01 DIAGNOSIS — I10 Essential (primary) hypertension: Secondary | ICD-10-CM | POA: Insufficient documentation

## 2024-03-01 LAB — COMPREHENSIVE METABOLIC PANEL WITH GFR
ALT: 20 U/L (ref 0–44)
AST: 30 U/L (ref 15–41)
Albumin: 3.9 g/dL (ref 3.5–5.0)
Alkaline Phosphatase: 68 U/L (ref 38–126)
Anion gap: 10 (ref 5–15)
BUN: 19 mg/dL (ref 8–23)
CO2: 25 mmol/L (ref 22–32)
Calcium: 9.1 mg/dL (ref 8.9–10.3)
Chloride: 97 mmol/L — ABNORMAL LOW (ref 98–111)
Creatinine, Ser: 1.39 mg/dL — ABNORMAL HIGH (ref 0.61–1.24)
GFR, Estimated: 55 mL/min — ABNORMAL LOW (ref >60)
Glucose, Bld: 100 mg/dL — ABNORMAL HIGH (ref 70–99)
Potassium: 4.6 mmol/L (ref 3.5–5.1)
Sodium: 132 mmol/L — ABNORMAL LOW (ref 135–145)
Total Bilirubin: 1 mg/dL (ref 0.0–1.2)
Total Protein: 8.5 g/dL — ABNORMAL HIGH (ref 6.5–8.1)

## 2024-03-01 LAB — I-STAT CHEM 8, ED
BUN: 28 mg/dL — ABNORMAL HIGH (ref 8–23)
BUN: 22 mg/dL (ref 8–23)
Calcium, Ion: 0.96 mmol/L — ABNORMAL LOW (ref 1.15–1.40)
Calcium, Ion: 1.15 mmol/L (ref 1.15–1.40)
Chloride: 101 mmol/L (ref 98–111)
Chloride: 97 mmol/L — ABNORMAL LOW (ref 98–111)
Creatinine, Ser: 1.6 mg/dL — ABNORMAL HIGH (ref 0.61–1.24)
Creatinine, Ser: 1.6 mg/dL — ABNORMAL HIGH (ref 0.61–1.24)
Glucose, Bld: 95 mg/dL (ref 70–99)
Glucose, Bld: 96 mg/dL (ref 70–99)
HCT: 38 % — ABNORMAL LOW (ref 39.0–52.0)
HCT: 38 % — ABNORMAL LOW (ref 39.0–52.0)
Hemoglobin: 12.9 g/dL — ABNORMAL LOW (ref 13.0–17.0)
Hemoglobin: 12.9 g/dL — ABNORMAL LOW (ref 13.0–17.0)
Potassium: 7 mmol/L (ref 3.5–5.1)
Potassium: 4.7 mmol/L (ref 3.5–5.1)
Sodium: 130 mmol/L — ABNORMAL LOW (ref 135–145)
Sodium: 134 mmol/L — ABNORMAL LOW (ref 135–145)
TCO2: 23 mmol/L (ref 22–32)
TCO2: 24 mmol/L (ref 22–32)

## 2024-03-01 LAB — CBC
HCT: 36.3 % — ABNORMAL LOW (ref 39.0–52.0)
Hemoglobin: 12.1 g/dL — ABNORMAL LOW (ref 13.0–17.0)
MCH: 34.6 pg — ABNORMAL HIGH (ref 26.0–34.0)
MCHC: 33.3 g/dL (ref 30.0–36.0)
MCV: 103.7 fL — ABNORMAL HIGH (ref 80.0–100.0)
Platelets: 105 K/uL — ABNORMAL LOW (ref 150–400)
RBC: 3.5 MIL/uL — ABNORMAL LOW (ref 4.22–5.81)
RDW: 13.3 % (ref 11.5–15.5)
WBC: 3.7 K/uL — ABNORMAL LOW (ref 4.0–10.5)
nRBC: 0 % (ref 0.0–0.2)

## 2024-03-01 LAB — URINALYSIS, ROUTINE W REFLEX MICROSCOPIC
Glucose, UA: NEGATIVE mg/dL
Hgb urine dipstick: NEGATIVE
Ketones, ur: 40 mg/dL — AB
Leukocytes,Ua: NEGATIVE
Nitrite: NEGATIVE
Protein, ur: NEGATIVE mg/dL
Specific Gravity, Urine: 1.02 (ref 1.005–1.030)
pH: 6 (ref 5.0–8.0)

## 2024-03-01 LAB — I-STAT CG4 LACTIC ACID, ED
Lactic Acid, Venous: 2.1 mmol/L (ref 0.5–1.9)
Lactic Acid, Venous: 2 mmol/L (ref 0.5–1.9)
Lactic Acid, Venous: 0.9 mmol/L (ref 0.5–1.9)

## 2024-03-01 LAB — LIPASE, BLOOD: Lipase: 26 U/L (ref 11–51)

## 2024-03-01 LAB — CBG MONITORING, ED
Glucose-Capillary: 105 mg/dL — ABNORMAL HIGH (ref 70–99)
Glucose-Capillary: 83 mg/dL (ref 70–99)

## 2024-03-01 MED ORDER — SODIUM CHLORIDE 0.9 % IV BOLUS
500.0000 mL | Freq: Once | INTRAVENOUS | Status: AC
  Administered 2024-03-01: 500 mL via INTRAVENOUS

## 2024-03-01 MED ORDER — IOHEXOL 350 MG/ML SOLN
75.0000 mL | Freq: Once | INTRAVENOUS | Status: AC | PRN
  Administered 2024-03-01: 75 mL via INTRAVENOUS

## 2024-03-01 NOTE — ED Provider Notes (Signed)
 [place] EMERGENCY DEPARTMENT AT [facility] Provider Note   CSN: [id number] Arrival date & time: 03/01/24  0807     Patient presents with: Abdominal Pain, Back Pain, Constipation, Nausea, and Weakness   Jacob Erickson is a 68 y.o. male.    Abdominal Pain Associated symptoms: constipation   Back Pain Associated symptoms: abdominal pain and weakness   Constipation Associated symptoms: abdominal pain and back pain   Weakness Associated symptoms: abdominal pain      Patient presents because of weakness as well as abdominal pain.  Patient states that over the past 2 to 3 days, he said difficulty with bowel movements.  He did have a bowel movement yesterday that was large and formed and hard.  No blood in his stool that he could appreciate.  No melena.  No bright red blood per rectum.  Patient states has been having back pain since then.  Sometimes will have episode of back pain after really large bowel movements and/or when he is constipated.  Patient states that because of this, he has not really been eating much.  He feels like very fatigued.  Endorses weakness.  Is been drinking water.  States that he becomes lightheaded when going from sitting to standing position here recently.  Patient states that he is taking fluconazole because of a viral infection of his esophagus.  C. difficile in the past but no recent antibiotic exposure.  No chest pain or shortness of breath.  Been compliant with all of his medications.  Patient states that he is having some polyuria.  No dysuria.  Previous medical history reviewed : pmh of sarcoidosis status post bilateral lung transplant in 2002, on immunosuppressants, follows with [facility] transplant team, CAD status post PCI with stent in 2016, HTN, hyperlipidemia, history of Niesen fundoplication Patient last admitted to this hospital system.  Admitted in March 2025.  AKI.  Worsened likely due to acute diarrhea nausea and poor p.o. intake.   Hypotension.  C. difficile diagnosis.     Prior to Admission medications   Medication Sig Start Date End Date Taking? Authorizing Provider  ALPRAZolam  (XANAX ) 0.5 MG tablet Take 0.25-0.5 mg by mouth every 8 (eight) hours as needed. 05/06/23   [provider]  atorvastatin (LIPITOR) 20 MG tablet Take 20 mg by mouth daily.    [provider]  azithromycin (ZITHROMAX) 250 MG tablet Take 250 mg by mouth every Monday, Wednesday, and Friday.    [provider]  calcium-vitamin D (OSCAL WITH D) 250-125 MG-UNIT per tablet Take 2 tablets by mouth 2 (two) times daily.    [provider]  diazepam  (VALIUM ) 2 MG tablet Take 1 tablet (2 mg total) by mouth every 6 (six) hours as needed for muscle spasms. 10/05/23   [name], MD  ezetimibe (ZETIA) 10 MG tablet Take 10 mg by mouth daily.    [provider]  ferrous sulfate 325 (65 FE) MG tablet Take 325 mg by mouth 2 (two) times daily with a meal.    [provider]  fidaxomicin  (DIFICID ) 200 MG TABS tablet Take 1 tablet (200 mg total) by mouth 2 (two) times daily. 09/12/23   Horton, Roxie HERO, DO  fidaxomicin  (DIFICID ) 200 MG TABS tablet Take 1 tablet (200 mg total) by mouth 2 (two) times daily for 10 days. 09/13/23     HYDROcodone -acetaminophen  (NORCO/VICODIN) 5-325 MG tablet Take 1 tablet by mouth every 6 (six) hours as needed for SEVERE pain for 30 days. 09/13/23  ibandronate (BONIVA) 150 MG tablet Take 150 mg by mouth every 30 (thirty) days. Take in the morning with a full glass of water, on an empty stomach, and do not take anything else by mouth or lie down for the next 30 min.    [provider]  latanoprost  (XALATAN ) 0.005 % ophthalmic solution Place 2 drops into both eyes at bedtime.    [provider]  magnesium oxide (MAG-OX) 400 MG tablet Take 800 mg by mouth 2 (two) times daily. Take 2 tablets (800 mg total) by mouth 2 (two) times daily at lunch and dinner    [provider]  methocarbamol  (ROBAXIN ) 500 MG tablet Take 500 mg by mouth 2 (two) times daily as needed for muscle spasms. 04/20/23   [provider]  metoprolol tartrate (LOPRESSOR) 25 MG tablet Take 25 mg by mouth 2 (two) times daily.    [provider]  Multiple Vitamin (MULTIVITAMIN WITH MINERALS) TABS tablet Take 1 tablet by mouth daily.    [provider]  Multiple Vitamins-Minerals (PRESERVISION AREDS 2 PO) Take 1 capsule by mouth in the morning and at bedtime.    [provider]  ondansetron  (ZOFRAN -ODT) 4 MG disintegrating tablet Take 1 tablet (4 mg total) by mouth every 8 (eight) hours as needed for nausea or vomiting. 06/17/23   [name], Ripudeep K, MD  pantoprazole  (PROTONIX ) 40 MG tablet Take 40 mg by mouth daily. 06/08/23   [provider]  predniSONE  (DELTASONE ) 5 MG tablet Take 5 mg by mouth daily with breakfast.    [provider]  predniSONE  (DELTASONE ) 50 MG tablet 1 p.o. daily x 4 10/05/23   Dasie Faden, MD  sulfamethoxazole -trimethoprim  (BACTRIM ,SEPTRA ) 400-80 MG tablet Take 1 tablet by mouth daily.    [provider]  tacrolimus  (PROGRAF ) 1 MG capsule Take 4 mg by mouth 2 (two) times daily.    [provider]  tamsulosin  (FLOMAX ) 0.4 MG CAPS capsule Take 0.4 mg by mouth daily. 05/26/23   [provider]  timolol  (TIMOPTIC ) 0.25 % ophthalmic solution Place 1 drop into both eyes daily. 05/07/23   [provider]  ZARXIO 300 MCG/0.5ML SOSY injection  05/06/23   [provider]    Allergies: Dextromethorphan-guaifenesin    Review of Systems  Gastrointestinal:  Positive for abdominal pain and constipation.  Musculoskeletal:  Positive for back pain.  Neurological:  Positive for weakness.    Updated Vital Signs BP 127/62   Pulse 85   Temp 98 F (36.7 C) (Oral)   Resp (!) 24   Ht 6' 2 (1.88 m)   Wt 63.5 kg   SpO2 100%   BMI 17.97 kg/m   Physical Exam Vitals and nursing note  reviewed.  Constitutional:      General: He is not in acute distress.    Appearance: He is well-developed.  HENT:     Head: Normocephalic and atraumatic.  Eyes:     Conjunctiva/sclera: Conjunctivae normal.  Cardiovascular:     Rate and Rhythm: Normal rate and regular rhythm.     Heart sounds: No murmur heard. Pulmonary:     Effort: Pulmonary effort is normal. No respiratory distress.     Breath sounds: Normal breath sounds.  Abdominal:     Palpations: Abdomen is soft.     Tenderness: There is no abdominal tenderness.  Musculoskeletal:        General: No swelling.     Cervical back: Neck supple.  Skin:    General:  Skin is warm and dry.     Capillary Refill: Capillary refill takes less than 2 seconds.  Neurological:     Mental Status: He is alert.  Psychiatric:        Mood and Affect: Mood normal.     (all labs ordered are listed, but only abnormal results are displayed) Labs Reviewed  CBC - Abnormal; Notable for the following components:      Result Value   WBC 3.7 (*)    RBC 3.50 (*)    Hemoglobin 12.1 (*)    HCT 36.3 (*)    MCV 103.7 (*)    MCH 34.6 (*)    Platelets 105 (*)    All other components within normal limits  URINALYSIS, ROUTINE W REFLEX MICROSCOPIC - Abnormal; Notable for the following components:   Bilirubin Urine SMALL (*)    Ketones, ur 40 (*)    All other components within normal limits  COMPREHENSIVE METABOLIC PANEL WITH GFR - Abnormal; Notable for the following components:   Sodium 132 (*)    Chloride 97 (*)    Glucose, Bld 100 (*)    Creatinine, Ser 1.39 (*)    Total Protein 8.5 (*)    GFR, Estimated 55 (*)    All other components within normal limits  I-STAT CHEM 8, ED - Abnormal; Notable for the following components:   Sodium 130 (*)    Potassium 7.0 (*)    BUN 28 (*)    Creatinine, Ser 1.60 (*)    Calcium, Ion 0.96 (*)    Hemoglobin 12.9 (*)    HCT 38.0 (*)    All other components within normal limits  I-STAT CG4 LACTIC ACID, ED -  Abnormal; Notable for the following components:   Lactic Acid, Venous 2.1 (*)    All other components within normal limits  CBG MONITORING, ED - Abnormal; Notable for the following components:   Glucose-Capillary 105 (*)    All other components within normal limits  I-STAT CHEM 8, ED - Abnormal; Notable for the following components:   Sodium 134 (*)    Chloride 97 (*)    Creatinine, Ser 1.60 (*)    Hemoglobin 12.9 (*)    HCT 38.0 (*)    All other components within normal limits  I-STAT CG4 LACTIC ACID, ED - Abnormal; Notable for the following components:   Lactic Acid, Venous 2.0 (*)    All other components within normal limits  LIPASE, BLOOD  COMPREHENSIVE METABOLIC PANEL WITH GFR  CBG MONITORING, ED  I-STAT CG4 LACTIC ACID, ED  I-STAT CG4 LACTIC ACID, ED    EKG: EKG Interpretation Date/Time:  Wednesday March 01 2024 08:34:50 EST Ventricular Rate:  86 PR Interval:  162 QRS Duration:  108 QT Interval:  358 QTC Calculation: 428 R Axis:   -65  Text Interpretation: Sinus rhythm with Premature atrial complexes Left anterior fascicular block Minimal voltage criteria for LVH, may be normal variant ( Cornell product ) Abnormal ECG When compared with ECG of 16-Jun-2023 05:16, PREVIOUS ECG IS PRESENT Confirmed by Simon Rea [phone]) on 03/01/2024 8:45:24 AM  Radiology: CT ABDOMEN PELVIS W CONTRAST Result Date: 03/01/2024 CLINICAL DATA:  Lower abdominal pain.  Recent C diff infection. EXAM: CT ABDOMEN AND PELVIS WITH CONTRAST TECHNIQUE: Multidetector CT imaging of the abdomen and pelvis was performed using the standard protocol following bolus administration of intravenous contrast. RADIATION DOSE REDUCTION: This exam was performed according to the departmental dose-optimization program which includes automated exposure control, adjustment  of the mA and/or kV according to patient size and/or use of iterative reconstruction technique. CONTRAST:  75mL OMNIPAQUE  IOHEXOL  350 MG/ML SOLN  COMPARISON:  09/12/2023. FINDINGS: Lower chest: Scattered subpleural scarring. Pleuroparenchymal scarring in the posterolateral base of the left hemithorax. Left main coronary artery stent. Heart is enlarged. No pericardial or pleural effusion. There is pericardial calcification and thickening, as before. Air in the distal esophagus. Hepatobiliary: Liver and gallbladder are unremarkable. No biliary ductal dilatation. Pancreas: 4 mm hypodensities in the pancreatic head on the prior exam is not well-visualized. Spleen: Negative. Adrenals/Urinary Tract: Adrenal glands and right kidney are unremarkable. Small low-attenuation lesion in the left kidney. No specific follow-up necessary. Ureters are decompressed. Bladder is grossly unremarkable. Stomach/Bowel: Hiatal hernia repair. Stomach and small bowel are unremarkable. Appendix is not well-visualized. Moderate stool burden. Vascular/Lymphatic: Atherosclerotic calcification of the aorta. No pathologically enlarged lymph nodes. Reproductive: Prostate is mildly enlarged. Other: No free fluid.  Mesenteries and peritoneum are unremarkable. Musculoskeletal: Osteopenia. Right hip osteoarthritis. Degenerative changes in the spine. Lower thoracic vertebral body augmentations. L1 compression fracture is new in the interval. L3 and L4 compression fractures are old. IMPRESSION: 1. No acute findings. 2. Moderate stool burden. 3. L1 compression fracture is new in the interval but age indeterminate. 4. 4 mm hypodense lesion in the pancreatic head, seen on 09/12/2023, is not readily appreciated. MR abdomen was recommended at that time. 5. Enlarged prostate. 6. Aortic atherosclerosis (ICD10-I70.0). Coronary artery calcification. Electronically Signed   By: Newell Eke M.D.   On: 03/01/2024 13:04   CT Head Wo Contrast Result Date: 03/01/2024 EXAM: CT HEAD WITHOUT 03/01/2024 12:29:00 PM TECHNIQUE: CT of the head was performed without the administration of intravenous contrast.  Automated exposure control, iterative reconstruction, and/or weight based adjustment of the mA/kV was utilized to reduce the radiation dose to as low as reasonably achievable. COMPARISON: CT of the head dated 01/14/2021. CLINICAL HISTORY: acute headache. Eval for subarach or other bleed FINDINGS: BRAIN AND VENTRICLES: No acute intracranial hemorrhage. No mass effect or midline shift. No extra-axial fluid collection. No evidence of acute infarct. No hydrocephalus. Moderate periventricular and deep cerebral white matter disease. ORBITS: Right lens replacement. SINUSES AND MASTOIDS: Partial opacification of left mastoid air cells. SOFT TISSUES AND SKULL: No acute skull fracture. No acute soft tissue abnormality. IMPRESSION: 1. No acute intracranial abnormality. 2. Moderate periventricular and deep cerebral white matter disease. Electronically signed by: Evalene Coho MD 03/01/2024 12:50 PM EST RP Workstation: [id number]   DG Chest Portable 1 View Result Date: 03/01/2024 EXAM: 1 VIEW(S) XRAY OF THE CHEST 03/01/2024 09:39:00 AM COMPARISON: 12/16/2021 CLINICAL HISTORY: history of lung transplant. Weakness. No fever/chills FINDINGS: LUNGS AND PLEURA: Left basilar scarring. Chronic blunting of left lateral costophrenic angle. No pneumothorax. HEART AND MEDIASTINUM: Mediastinal and bilateral hilar lymph nodes unchanged and compatible with prior pyelonephritis disease. BONES AND SOFT TISSUES: Intact lower thoracic cerclage wires. Vertebroplasty material noted overlying multiple mid thoracic vertebral fractures. IMPRESSION: 1. No acute cardiopulmonary process. 2. Chronic left basilar pleuroparenchymal scarring. 3. Vertebroplasty material overlying multiple mid thoracic vertebral fractures. Electronically signed by: Selinda Blue MD 03/01/2024 10:13 AM EST RP Workstation: [id number]     Procedures   Medications Ordered in the ED  sodium chloride  0.9 % bolus 500 mL (0 mLs Intravenous Stopped 03/01/24 1135)  sodium  chloride 0.9 % bolus 500 mL (0 mLs Intravenous Stopped 03/01/24 1301)  iohexol  (OMNIPAQUE ) 350 MG/ML injection 75 mL (75 mLs Intravenous Contrast Given 03/01/24 1215)  Medical Decision Making Amount and/or Complexity of Data Reviewed Labs: ordered. Radiology: ordered.  Risk Prescription drug management.     HPI:   Patient presents because of weakness as well as abdominal pain.  Patient states that over the past 2 to 3 days, he said difficulty with bowel movements.  He did have a bowel movement yesterday that was large and formed and hard.  No blood in his stool that he could appreciate.  No melena.  No bright red blood per rectum.  Patient states has been having back pain since then.  Sometimes will have episode of back pain after really large bowel movements and/or when he is constipated.  Patient states that because of this, he has not really been eating much.  He feels like very fatigued.  Endorses weakness.  Is been drinking water.  States that he becomes lightheaded when going from sitting to standing position here recently.  Patient states that he is taking fluconazole because of a viral infection of his esophagus.  C. difficile in the past but no recent antibiotic exposure.  No chest pain or shortness of breath.  Been compliant with all of his medications.  Patient states that he is having some polyuria.  No dysuria.  Previous medical history reviewed : pmh of sarcoidosis status post bilateral lung transplant in 2002, on immunosuppressants, follows with [facility] transplant team, CAD status post PCI with stent in 2016, HTN, hyperlipidemia, history of Niesen fundoplication Patient last admitted to this hospital system.  Admitted in March 2025.  AKI.  Worsened likely due to acute diarrhea nausea and poor p.o. intake.  Hypotension.  C. difficile diagnosis.  MDM:   Upon exam, patient ANO x 3 GCS 15.  Initial vital signs show patient be hypotensive 82/48.   Whenever I was in the room, MAP was 70-75.  Systolic 100.  Saturation 100%.  No tachypnea or tachycardia.  No chest pain or shortness of breath.  Will obtain screening chest x-ray but no concerns for obvious pneumonia.  No concerns any kind of ACS pathology at this time.  Mild pain to palpation in the lower abdomen.  No significant rebound or guarding.  Will obtain screening laboratory workup including lipase, LFTs.  Will plan on obtaining CT imaging.  Need to rule out ileus or obstruction.  Depending on patient's creatinine EFR, this will dictate whether patient will receive IV contrast.  Will obtain i-STAT CMP for further assessment.  Reevaluation:   Upon reexamination, patient hemodynamically stable.  Remains A&O x 3 with GCS 15.   Obtained septic workup.  Initial lactic acid minimally elevated at 2.  Likely because of dehydration.  After liter of fluid, lactic resolved.  No leukocytosis.  No anemia.  No AKI.  Initial i-STAT Chem-8 showed hyperkalemia but this was lab error given repeat normal Chem-8 as well as CMP showed normal potassium.  UA unremarkable.  No UTI.  Some ketones likely because of dehydration.  Did obtain CT scan of the abdomen.  No acute pathology was seen.  No ileus obstruction.  Does have a C1 compression fracture.  With speaking the patient, is been having this pain for months.  Unclear whenever this happened.  He is neurologically intact.  He can follow-up with spine surgery.  History of thoracic compression fractures in the past.  Patient afebrile.  Normotensive.  Subsequent discharge stable condition.   Interventions: 1000 cc NS   EKG Interpreted by Me: sinus    Cardiac Tele Interpreted by Me: sinus  I have independently interpreted the CXR  and CT  images and agree with the radiologist finding   Social Determinant of Health: Live at home with wife    Disposition and Follow Up: PCP, Spine       Final diagnoses:  Drug-induced constipation  Closed  compression fracture of body of L1 vertebra [facility])    ED Discharge Orders     None          Simon Lavonia SAILOR, MD 03/01/24 [phone]

## 2024-03-01 NOTE — ED Notes (Signed)
 IV team at bedside, notified we need repeat cmp and I-stat

## 2024-03-01 NOTE — ED Notes (Signed)
 Patient transported to CT

## 2024-03-01 NOTE — ED Triage Notes (Signed)
 Pt. Stated, Jacob Erickson not been able to go to bathroom for 2 days only gas and my stomach is really hurting and going to my back, making me feel bad and weak. Last BM was 2 days ago. I wjust had C-diff. This SmartLink has not been configured with any valid records.

## 2024-03-01 NOTE — Discharge Instructions (Signed)
 Take stool softener you can also take MiraLAX.  If you ever have any kind of fever or chills then please come back to the ED for further evaluation.  You are slightly dehydrated when he first came in.  After a liter of fluid, your vital signs look much better.  You do have a compression fracture of your L1 vertebrae.  Unclear whenever this happened.  You been having pain for months.  If you are having kind of bowel or bladder incontinence, numbness your groin area come back to the ED.  He can follow-up with spine surgery for this.

## 2024-03-01 NOTE — ED Notes (Signed)
 Pt informed we need urine sample, unable to urinate at this time. Provided with urinal

## 2024-03-21 ENCOUNTER — Emergency Department (HOSPITAL_BASED_OUTPATIENT_CLINIC_OR_DEPARTMENT_OTHER): Admitting: Radiology

## 2024-03-21 ENCOUNTER — Emergency Department (HOSPITAL_BASED_OUTPATIENT_CLINIC_OR_DEPARTMENT_OTHER): Admission: EM | Admit: 2024-03-21 | Discharge: 2024-03-22 | Disposition: A | Discharge status: Home / Self Care

## 2024-03-21 ENCOUNTER — Emergency Department (HOSPITAL_BASED_OUTPATIENT_CLINIC_OR_DEPARTMENT_OTHER)

## 2024-03-21 ENCOUNTER — Encounter (HOSPITAL_BASED_OUTPATIENT_CLINIC_OR_DEPARTMENT_OTHER): Payer: Self-pay | Admitting: Radiology

## 2024-03-21 ENCOUNTER — Other Ambulatory Visit: Payer: Self-pay

## 2024-03-21 DIAGNOSIS — R791 Abnormal coagulation profile: Secondary | ICD-10-CM | POA: Insufficient documentation

## 2024-03-21 DIAGNOSIS — R0602 Shortness of breath: Secondary | ICD-10-CM | POA: Diagnosis present

## 2024-03-21 DIAGNOSIS — I10 Essential (primary) hypertension: Secondary | ICD-10-CM | POA: Insufficient documentation

## 2024-03-21 DIAGNOSIS — Z942 Lung transplant status: Secondary | ICD-10-CM | POA: Diagnosis not present

## 2024-03-21 DIAGNOSIS — R051 Acute cough: Secondary | ICD-10-CM

## 2024-03-21 DIAGNOSIS — R079 Chest pain, unspecified: Secondary | ICD-10-CM | POA: Diagnosis not present

## 2024-03-21 DIAGNOSIS — Z79899 Other long term (current) drug therapy: Secondary | ICD-10-CM | POA: Diagnosis not present

## 2024-03-21 LAB — CBC WITH DIFFERENTIAL/PLATELET
Abs Immature Granulocytes: 0.01 K/uL (ref 0.00–0.07)
Basophils Absolute: 0 K/uL (ref 0.0–0.1)
Basophils Relative: 0 %
Eosinophils Absolute: 0 K/uL (ref 0.0–0.5)
Eosinophils Relative: 1 %
HCT: 31.5 % — ABNORMAL LOW (ref 39.0–52.0)
Hemoglobin: 10.9 g/dL — ABNORMAL LOW (ref 13.0–17.0)
Immature Granulocytes: 0 %
Lymphocytes Relative: 29 %
Lymphs Abs: 1.2 K/uL (ref 0.7–4.0)
MCH: 35.3 pg — ABNORMAL HIGH (ref 26.0–34.0)
MCHC: 34.6 g/dL (ref 30.0–36.0)
MCV: 101.9 fL — ABNORMAL HIGH (ref 80.0–100.0)
Monocytes Absolute: 0.6 K/uL (ref 0.1–1.0)
Monocytes Relative: 15 %
Neutro Abs: 2.4 K/uL (ref 1.7–7.7)
Neutrophils Relative %: 55 %
Platelets: 118 K/uL — ABNORMAL LOW (ref 150–400)
RBC: 3.09 MIL/uL — ABNORMAL LOW (ref 4.22–5.81)
RDW: 13.2 % (ref 11.5–15.5)
WBC: 4.2 K/uL (ref 4.0–10.5)
nRBC: 0 % (ref 0.0–0.2)

## 2024-03-21 LAB — COMPREHENSIVE METABOLIC PANEL WITH GFR
ALT: 27 U/L (ref 0–44)
AST: 33 U/L (ref 15–41)
Albumin: 3.7 g/dL (ref 3.5–5.0)
Alkaline Phosphatase: 92 U/L (ref 38–126)
Anion gap: 10 (ref 5–15)
BUN: 14 mg/dL (ref 8–23)
CO2: 24 mmol/L (ref 22–32)
Calcium: 9.2 mg/dL (ref 8.9–10.3)
Chloride: 102 mmol/L (ref 98–111)
Creatinine, Ser: 0.94 mg/dL (ref 0.61–1.24)
GFR, Estimated: >60 mL/min
Glucose, Bld: 101 mg/dL — ABNORMAL HIGH (ref 70–99)
Potassium: 4.6 mmol/L (ref 3.5–5.1)
Sodium: 137 mmol/L (ref 135–145)
Total Bilirubin: 0.5 mg/dL (ref 0.0–1.2)
Total Protein: 7.6 g/dL (ref 6.5–8.1)

## 2024-03-21 LAB — TROPONIN T, HIGH SENSITIVITY: Troponin T High Sensitivity: <15 ng/L (ref 0–19)

## 2024-03-21 LAB — D-DIMER, QUANTITATIVE: D-Dimer, Quant: 0.72 ug{FEU}/mL — ABNORMAL HIGH (ref 0.00–0.50)

## 2024-03-21 MED ORDER — IOHEXOL 350 MG/ML SOLN
75.0000 mL | Freq: Once | INTRAVENOUS | Status: AC | PRN
  Administered 2024-03-21: 75 mL via INTRAVENOUS

## 2024-03-21 MED ORDER — LACTATED RINGERS IV BOLUS
1000.0000 mL | Freq: Once | INTRAVENOUS | Status: AC
  Administered 2024-03-21: 1000 mL via INTRAVENOUS

## 2024-03-21 MED ORDER — OXYCODONE-ACETAMINOPHEN 5-325 MG PO TABS: 1.0000 | ORAL_TABLET | Freq: Four times a day (QID) | ORAL | 0 refills | Status: AC | PRN

## 2024-03-21 MED ORDER — KETOROLAC TROMETHAMINE 30 MG/ML IJ SOLN
15.0000 mg | Freq: Once | INTRAMUSCULAR | Status: AC
  Administered 2024-03-21: 15 mg via INTRAVENOUS
  Filled 2024-03-21: qty 1

## 2024-03-21 MED ORDER — ACETAMINOPHEN 500 MG PO TABS
1000.0000 mg | ORAL_TABLET | Freq: Once | ORAL | Status: AC
  Administered 2024-03-21: 1000 mg via ORAL
  Filled 2024-03-21: qty 2

## 2024-03-21 NOTE — ED Provider Notes (Signed)
 " [place] EMERGENCY DEPARTMENT AT [facility] Provider Note   CSN: [id number] Arrival date & time: 03/21/24  1439     Patient presents with: Influenza   Jacob Erickson is a 68 y.o. male with a history of double lung transplant in 2002 and hypertension who presents with chest pain and shortness of breath after being diagnosed with influenza last week.  Patient had several days of flulike symptoms including fever, chills, myalgias, headache, nasal congestion, sore throat, and nonproductive cough.  Patient states that he completed a Tamiflu and prednisone  treatment regimen, however is now having continued shortness of breath and chest pain.  Patient denies any nausea, vomiting, or diarrhea. The patient is in no acute distress.     Influenza Presenting symptoms: shortness of breath        Prior to Admission medications  Medication Sig Start Date End Date Taking? Authorizing Provider  alendronate (FOSAMAX) 70 MG tablet Take 70 mg by mouth once a week. Take with a full glass of water. Do not lie down for the next 30 min. 02/24/24 02/23/25 Yes [provider]  ALPRAZolam  (XANAX ) 0.5 MG tablet Take 0.25-0.5 mg by mouth every 8 (eight) hours as needed. 05/06/23   [provider]  atorvastatin (LIPITOR) 20 MG tablet Take 20 mg by mouth daily.    [provider]  azithromycin (ZITHROMAX) 250 MG tablet Take 250 mg by mouth every Monday, Wednesday, and Friday.    [provider]  calcium-vitamin D (OSCAL WITH D) 250-125 MG-UNIT per tablet Take 2 tablets by mouth 2 (two) times daily.    [provider]  diazepam  (VALIUM ) 2 MG tablet Take 1 tablet (2 mg total) by mouth every 6 (six) hours as needed for muscle spasms. 10/05/23   [name], MD  ezetimibe (ZETIA) 10 MG tablet Take 10 mg by mouth daily.    [provider]  ferrous sulfate 325 (65 FE) MG tablet Take 325 mg by mouth 2 (two) times daily with a meal.    [provider]  fidaxomicin  (DIFICID ) 200 MG TABS tablet Take 1 tablet (200 mg total) by mouth 2 (two) times daily. 09/12/23   Horton, Roxie HERO, DO  fidaxomicin  (DIFICID ) 200 MG TABS tablet Take 1 tablet (200 mg total) by mouth 2 (two) times daily for 10 days. 09/13/23     HYDROcodone -acetaminophen  (NORCO/VICODIN) 5-325 MG tablet Take 1 tablet by mouth every 6 (six) hours as needed for SEVERE pain for 30 days. 09/13/23     ibandronate (BONIVA) 150 MG tablet Take 150 mg by mouth every 30 (thirty) days. Take in the morning with a full glass of water, on an empty stomach, and do not take anything else by mouth or lie down for the next 30 min.    [provider]  latanoprost  (XALATAN ) 0.005 % ophthalmic solution Place 2 drops into both eyes at bedtime.    [provider]  magnesium oxide (MAG-OX) 400 MG tablet Take 800 mg by mouth 2 (two) times daily. Take 2 tablets (800 mg total) by mouth 2 (two) times daily at lunch and dinner    [provider]  methocarbamol  (ROBAXIN ) 500 MG tablet Take 500 mg by mouth 2 (two) times daily as needed for muscle spasms. 04/20/23   [provider]  metoprolol tartrate (LOPRESSOR) 25 MG tablet Take 25 mg by mouth 2 (two) times daily.    [provider]  Multiple Vitamin (MULTIVITAMIN WITH MINERALS) TABS tablet Take 1  tablet by mouth daily.    [provider]  Multiple Vitamins-Minerals (PRESERVISION AREDS 2 PO) Take 1 capsule by mouth in the morning and at bedtime.    [provider]  ondansetron  (ZOFRAN -ODT) 4 MG disintegrating tablet Take 1 tablet (4 mg total) by mouth every 8 (eight) hours as needed for nausea or vomiting. 06/17/23   [name], Ripudeep K, MD  pantoprazole  (PROTONIX ) 40 MG tablet Take 40 mg by mouth daily. 06/08/23   [provider]  predniSONE  (DELTASONE ) 5 MG tablet Take 5 mg by mouth daily with breakfast.    [provider]  predniSONE  (DELTASONE ) 50 MG tablet 1 p.o. daily x 4  10/05/23   Dasie Faden, MD  sulfamethoxazole -trimethoprim  (BACTRIM ,SEPTRA ) 400-80 MG tablet Take 1 tablet by mouth daily.    [provider]  tacrolimus  (PROGRAF ) 1 MG capsule Take 4 mg by mouth 2 (two) times daily.    [provider]  tamsulosin  (FLOMAX ) 0.4 MG CAPS capsule Take 0.4 mg by mouth daily. 05/26/23   [provider]  timolol  (TIMOPTIC ) 0.25 % ophthalmic solution Place 1 drop into both eyes daily. 05/07/23   [provider]  ZARXIO 300 MCG/0.5ML SOSY injection  05/06/23   [provider]    Allergies: Dextromethorphan-guaifenesin    Review of Systems  Respiratory:  Positive for shortness of breath.     Updated Vital Signs BP (!) 142/74   Pulse (!) 110   Temp 99.6 F (37.6 C) (Oral)   Resp 16   SpO2 97%   Physical Exam Vitals and nursing note reviewed.  Constitutional:      General: He is awake. He is not in acute distress.    Appearance: Normal appearance. He is not toxic-appearing.  HENT:     Head: Normocephalic and atraumatic.  Eyes:     General: Lids are normal. Vision grossly intact.     Extraocular Movements: Extraocular movements intact.     Conjunctiva/sclera: Conjunctivae normal.     Pupils: Pupils are equal, round, and reactive to light.  Cardiovascular:     Rate and Rhythm: Normal rate and regular rhythm.     Pulses:          Radial pulses are 2+ on the right side and 2+ on the left side.  Pulmonary:     Effort: Pulmonary effort is normal.     Breath sounds: Normal breath sounds. No wheezing.     Comments: Patient has no difficulty speaking in complete sentences.  Abdominal:     General: Abdomen is flat.     Palpations: Abdomen is soft.     Tenderness: There is no abdominal tenderness.  Musculoskeletal:     Cervical back: Full passive range of motion without pain and neck supple. No rigidity. No spinous process tenderness.  Skin:    General: Skin is warm and dry.     Capillary Refill: Capillary refill  takes less than 2 seconds.     Findings: No rash.  Neurological:     General: No focal deficit present.     Mental Status: He is alert.  Psychiatric:        Attention and Perception: Attention normal.        Mood and Affect: Mood normal.        Speech: Speech normal.     (all labs ordered are listed, but only abnormal results are displayed) Labs Reviewed  CBC WITH DIFFERENTIAL/PLATELET - Abnormal; Notable for the following components:  Result Value   RBC 3.09 (*)    Hemoglobin 10.9 (*)    HCT 31.5 (*)    MCV 101.9 (*)    MCH 35.3 (*)    Platelets 118 (*)    All other components within normal limits  COMPREHENSIVE METABOLIC PANEL WITH GFR - Abnormal; Notable for the following components:   Glucose, Bld 101 (*)    All other components within normal limits  D-DIMER, QUANTITATIVE - Abnormal; Notable for the following components:   D-Dimer, Quant 0.72 (*)    All other components within normal limits  TROPONIN T, HIGH SENSITIVITY    EKG: EKG Interpretation Date/Time:  Tuesday March 21 2024 19:45:41 EST Ventricular Rate:  94 PR Interval:  157 QRS Duration:  108 QT Interval:  341 QTC Calculation: 427 R Axis:   -58  Text Interpretation: Sinus tachycardia Supraventricular bigeminy LAD, consider left anterior fascicular block Abnormal R-wave progression, late transition Left ventricular hypertrophy Compared with prior EKG from 03/01/2024 Confirmed by Gennaro Bouchard ([id number]) on 03/21/2024 8:13:00 PM  Radiology: DG Chest 2 View Result Date: 03/21/2024 EXAM: 2 VIEW(S) XRAY OF THE CHEST 03/21/2024 07:36:00 PM COMPARISON: 03/01/2024 CLINICAL HISTORY: cp FINDINGS: LUNGS AND PLEURA: Elevated left hemidiaphragm. Stable left pleural thickening. Stable left-sided volume loss. No focal pulmonary opacity. No pleural effusion. No pneumothorax. HEART AND MEDIASTINUM: Calcified mediastinal lymph nodes. No acute abnormality of the cardiac and mediastinal silhouettes. BONES AND SOFT  TISSUES: Multiple thoracic compression deformities, unchanged. Vertebroplasties noted. Cerclage wire fixation of lower sternum noted. IMPRESSION: 1. No acute cardiopulmonary abnormality. 2. 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 Electronically signed by: Dorethia Molt MD 03/21/2024 08:49 PM EST RP Workstation: [id number]     Procedures   Medications Ordered in the ED  acetaminophen  (TYLENOL ) tablet 1,000 mg (1,000 mg Oral Given 03/21/24 2006)  lactated ringers  bolus 1,000 mL (1,000 mLs Intravenous New Bag/Given 03/21/24 2009)  iohexol  (OMNIPAQUE ) 350 MG/ML injection 75 mL (75 mLs Intravenous Contrast Given 03/21/24 2116)  ketorolac  (TORADOL ) 30 MG/ML injection 15 mg (15 mg Intravenous Given 03/21/24 2228)                                 Medical Decision Making Amount and/or Complexity of Data Reviewed Labs: ordered. Decision-making details documented in ED Course. Radiology: ordered.  Risk OTC drugs. Prescription drug management.   Patient presents to the ED for: Chest pain, shortness of breath This involves an extensive number of treatment options  Differential diagnosis includes: ACS Infectious etiology Post-infection peri/myocarditis  Co-morbid conditions: Lung transplant in 2002, hypertension  Additional history/records obtained and reviewed: Additional history obtained from  wife who presents as a good historian  Clinical Course as of 03/21/24 2235  Tue Mar 21, 2024  2009 Temp: 98.6 F (37 C) Afebrile, tachycardic, no acute distress  [ML]  2009 D-dimer, quantitative(!) 0.72 [ML]  2019 CBC with Differential(!) Patient baseline [ML]  2019 Comprehensive metabolic panel(!) WNL [ML]  2030 EKG 12-Lead Sinus tachycardia [ML]  2049 DG Chest 2 View No acute  findings-chronic calcified lymph nodes identified  [ML]  2216 Troponin T, High Sensitivity Negative [ML]  2216 Patient given lactated ringer  bolus 1000 mL, acetaminophen , ketorolac  for symptomatic relief-well-tolerated [ML]    Clinical Course User Index [ML] Willma Bouchard CROME, PA  Data Reviewed / Actions Taken: Labs ordered/reviewed with my independent interpretation in ED course above. Imaging ordered/reviewed with my independent interpretation in ED course above. I agree with the radiologists interpretation.  EKG ordered/reviewed with my independent interpretation in ED course above. The patient was kept on continuous cardiac monitoring during the ED stay.  Management / Treatments: See ED course above for medications, treatments administered, and clinical rationale.   I have reviewed the patients home medicines and have made adjustments as needed  ED Course / Reassessments: Problem List: Chest pain, shortness of breath 68 year old male presented for chest pain and shortness of breath post influenza infection. Initial assessment included history, physical exam, and review of prior medical records. Laboratory studies, imaging, and other ancillary studies were obtained and key results included elevated D-dimer of 0.72 with pending CTA at time of handoff. Management included IV fluid bolus and analgesic therapy, with ongoing reassessment. Vital signs were obtained and monitored, and the patient remained stable throughout the stay.  Given patient's reassuring workup, patient's pain is unlikely ACS in nature.  Symptoms are unlikely infectious etiology given reassuring vital signs, no leukocytosis, and unremarkable chest x-ray.  Given elevated D-dimer, patient's CTA to be evaluated for the possibility of a PE.  If CTA is unremarkable patient should be cleared for discharge. Serial reassessments performed: Yes    Disposition: Disposition: Discharge pending - handoff to provider Hender[place] PA-C for  further evaluation after CTA results, and further assessment on patients clinical stability for discharge  This note was produced using Dragon Medical voice recognition. While I have reviewed and verified all clinical information, transcription errors may remain.      Final diagnoses:  None    ED Discharge Orders     None          Willma Duwaine CROME, [place] 03/21/24 2235    Gennaro Duwaine CROME, DO 03/21/24 2347  "

## 2024-03-21 NOTE — Discharge Instructions (Signed)
 Take the medication as prescribed.  Please use caution as it is a opiate prescription.  May make you sleepy.  Do not drive or operate machinery.  Make sure to follow-up with [facility] pulmonary team and PCP  Return for any worsening symptoms

## 2024-03-21 NOTE — ED Triage Notes (Signed)
 Patient states tested positive for the flu with his PCP two weeks ago. Received xray there which showed no pneumonia. States some of his symptoms have resolved but he is having severe back and neck pain since being diagnosed. History of double lung transplant 23 years ago.

## 2024-03-21 NOTE — ED Provider Notes (Signed)
 Care assumed from previous provider.  See note for full HPI.  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  Has known chronic back pain with fracture.  Lab and follow-up on CTA.  If negative DC home with pain management. Physical Exam  BP 133/69   Pulse 89   Temp 99.6 F (37.6 C) (Oral)   Resp 19   SpO2 99%   Physical Exam Vitals and nursing note reviewed.  Constitutional:      General: He is not in acute distress.    Appearance: He is well-developed. He is not ill-appearing or diaphoretic.  HENT:     Head: Atraumatic.  Eyes:     Pupils: Pupils are equal, round, and reactive to light.  Cardiovascular:     Rate and Rhythm: Normal rate and regular rhythm.  Pulmonary:     Effort: Pulmonary effort is normal. No respiratory distress.  Abdominal:     General: There is no distension.     Palpations: Abdomen is soft.  Musculoskeletal:        General: Normal range of motion.     Cervical back: Normal range of motion and neck supple.  Skin:    General: Skin is warm and dry.  Neurological:     General: No focal deficit present.     Mental Status: He is alert and oriented to person, place, and time.     Procedures  Procedures Labs Reviewed  CBC WITH DIFFERENTIAL/PLATELET - Abnormal; Notable for the following components:      Result Value   RBC 3.09 (*)    Hemoglobin 10.9 (*)    HCT 31.5 (*)    MCV 101.9 (*)    MCH 35.3 (*)    Platelets 118 (*)    All other components within normal limits  COMPREHENSIVE METABOLIC PANEL WITH GFR - Abnormal; Notable for the following components:   Glucose, Bld 101 (*)    All other components within normal limits  D-DIMER, QUANTITATIVE - Abnormal; Notable for the following components:   D-Dimer, Quant 0.72 (*)    All other components within normal limits  TROPONIN T, HIGH SENSITIVITY   CT  Angio Chest PE W and/or Wo Contrast Result Date: 03/21/2024 EXAM: CTA CHEST 03/21/2024 09:22:11 PM TECHNIQUE: CTA of the chest was performed without and with the administration of 75 mL of iohexol  (OMNIPAQUE ) 350 MG/ML injection. Multiplanar reformatted images are provided for review. MIP images are provided for review. Automated exposure control, iterative reconstruction, and/or weight based adjustment of the mA/kV was utilized to reduce the radiation dose to as low as reasonably achievable. COMPARISON: 02/26/2007 CLINICAL HISTORY: Pulmonary embolism (PE) suspected, high prob. FINDINGS: PULMONARY ARTERIES: Pulmonary arteries are adequately opacified for evaluation. No acute pulmonary embolus. Main pulmonary artery is normal in caliber. MEDIASTINUM: The heart and pericardium demonstrate no acute abnormality. There is no acute abnormality of the thoracic aorta. LYMPH NODES: Calcified mediastinal lymph nodes. No hilar or axillary lymphadenopathy. LUNGS AND PLEURA: Scattered pulmonary nodules measuring 2 to 3 mm. According to the Fleischner Society pulmonary nodule recommendations (2017), the finding is consistent with multiple solid pulmonary nodules <6 mm and the recommendation is no routine follow-up required in low-risk patients; optional CT at 12 months in high-risk patients. No focal consolidation or pulmonary edema. No evidence of pleural effusion or pneumothorax. UPPER ABDOMEN: Limited images of the upper abdomen  are unremarkable. SOFT TISSUES AND BONES: Multiple mid-thoracic chronic compression fractures status post augmentation. No acute soft tissue abnormality. IMPRESSION: 1. No pulmonary embolism. 2. Multiple solid pulmonary nodules measuring up to 3 mm; no routine follow-up is required in low-risk patients, and an optional CT at 12 months may be obtained in high-risk patients, as per Fleischner Society Guidelines. Electronically signed by: Franky Stanford MD 03/21/2024 11:29 PM EST RP Workstation:  [id number]   DG Chest 2 View Result Date: 03/21/2024 EXAM: 2 VIEW(S) XRAY OF THE CHEST 03/21/2024 07:36:00 PM COMPARISON: 03/01/2024 CLINICAL HISTORY: cp FINDINGS: LUNGS AND PLEURA: Elevated left hemidiaphragm. Stable left pleural thickening. Stable left-sided volume loss. No focal pulmonary opacity. No pleural effusion. No pneumothorax. HEART AND MEDIASTINUM: Calcified mediastinal lymph nodes. No acute abnormality of the cardiac and mediastinal silhouettes. BONES AND SOFT TISSUES: Multiple thoracic compression deformities, unchanged. Vertebroplasties noted. Cerclage wire fixation of lower sternum noted. IMPRESSION: 1. No acute cardiopulmonary abnormality. 2. 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 Electronically signed by: Dorethia Molt MD 03/21/2024 08:49 PM EST RP Workstation: [id number]   CT ABDOMEN PELVIS W CONTRAST Result Date: 03/01/2024 CLINICAL DATA:  Lower abdominal pain.  Recent C diff infection. EXAM: CT ABDOMEN AND PELVIS WITH CONTRAST TECHNIQUE: Multidetector CT imaging of the abdomen and pelvis was performed using the standard protocol following bolus administration of intravenous contrast. RADIATION DOSE REDUCTION: This exam was performed according to the departmental dose-optimization program which includes automated exposure control, adjustment of the mA and/or kV according to patient size and/or use of iterative reconstruction technique. CONTRAST:  75mL OMNIPAQUE  IOHEXOL  350 MG/ML SOLN COMPARISON:  09/12/2023. FINDINGS: Lower chest: Scattered subpleural scarring. Pleuroparenchymal scarring in the posterolateral base of the left hemithorax. Left main coronary artery stent. Heart is enlarged. No pericardial or pleural effusion. There is pericardial calcification and thickening, as  before. Air in the distal esophagus. Hepatobiliary: Liver and gallbladder are unremarkable. No biliary ductal dilatation. Pancreas: 4 mm hypodensities in the pancreatic head on the prior exam is not well-visualized. Spleen: Negative. Adrenals/Urinary Tract: Adrenal glands and right kidney are unremarkable. Small low-attenuation lesion in the left kidney. No specific follow-up necessary. Ureters are decompressed. Bladder is grossly unremarkable. Stomach/Bowel: Hiatal hernia repair. Stomach and small bowel are unremarkable. Appendix is not well-visualized. Moderate stool burden. Vascular/Lymphatic: Atherosclerotic calcification of the aorta. No pathologically enlarged lymph nodes. Reproductive: Prostate is mildly enlarged. Other: No free fluid.  Mesenteries and peritoneum are unremarkable. Musculoskeletal: Osteopenia. Right hip osteoarthritis. Degenerative changes in the spine. Lower thoracic vertebral body augmentations. L1 compression fracture is new in the interval. L3 and L4 compression fractures are old. IMPRESSION: 1. No acute findings. 2. Moderate stool burden. 3. L1 compression fracture is new in the interval but age indeterminate. 4. 4 mm hypodense lesion in the pancreatic head, seen on 09/12/2023, is not readily appreciated. MR abdomen was recommended at that time. 5. Enlarged prostate. 6. Aortic atherosclerosis (ICD10-I70.0). Coronary artery calcification. Electronically Signed   By: Newell Eke M.D.   On: 03/01/2024 13:04   CT Head Wo Contrast Result Date: 03/01/2024 EXAM: CT HEAD WITHOUT 03/01/2024 12:29:00 PM TECHNIQUE: CT of the head was performed without the administration of intravenous contrast. Automated exposure control, iterative reconstruction, and/or weight based adjustment of the mA/kV was utilized to reduce the radiation dose to as low as reasonably achievable. COMPARISON: CT of the head dated 01/14/2021.  CLINICAL HISTORY: acute headache. Eval for subarach or other bleed FINDINGS:  BRAIN AND VENTRICLES: No acute intracranial hemorrhage. No mass effect or midline shift. No extra-axial fluid collection. No evidence of acute infarct. No hydrocephalus. Moderate periventricular and deep cerebral white matter disease. ORBITS: Right lens replacement. SINUSES AND MASTOIDS: Partial opacification of left mastoid air cells. SOFT TISSUES AND SKULL: No acute skull fracture. No acute soft tissue abnormality. IMPRESSION: 1. No acute intracranial abnormality. 2. Moderate periventricular and deep cerebral white matter disease. Electronically signed by: Evalene Coho MD 03/01/2024 12:50 PM EST RP Workstation: [id number]   DG Chest Portable 1 View Result Date: 03/01/2024 EXAM: 1 VIEW(S) XRAY OF THE CHEST 03/01/2024 09:39:00 AM COMPARISON: 12/16/2021 CLINICAL HISTORY: history of lung transplant. Weakness. No fever/chills FINDINGS: LUNGS AND PLEURA: Left basilar scarring. Chronic blunting of left lateral costophrenic angle. No pneumothorax. HEART AND MEDIASTINUM: Mediastinal and bilateral hilar lymph nodes unchanged and compatible with prior pyelonephritis disease. BONES AND SOFT TISSUES: Intact lower thoracic cerclage wires. Vertebroplasty material noted overlying multiple mid thoracic vertebral fractures. IMPRESSION: 1. No acute cardiopulmonary process. 2. Chronic left basilar pleuroparenchymal scarring. 3. Vertebroplasty material overlying multiple mid thoracic vertebral fractures. Electronically signed by: Selinda Blue MD 03/01/2024 10:13 AM EST RP Workstation: [id number]    ED Course / MDM   Clinical Course as of 03/22/24 0022  Tue Mar 21, 2024  2009 Temp: 98.6 F (37 C) Afebrile, tachycardic, no acute distress  [ML]  2009 D-dimer, quantitative(!) 0.72 [ML]  2019 CBC with Differential(!) Patient baseline [ML]  2019 Comprehensive metabolic panel(!) WNL [ML]  2030 EKG 12-Lead Sinus tachycardia [ML]  2049 DG Chest 2 View No acute findings-chronic calcified lymph nodes identified  [ML]   2216 Troponin T, High Sensitivity Negative [ML]  2216 Patient given lactated ringer  bolus 1000 mL, acetaminophen , ketorolac  for symptomatic relief-well-tolerated [ML]    Clinical Course User Index [ML] Lloyd, Megan L, PA   Patient reassessed.  Pain controlled.  Patient states his back pain has been longstanding.  Previously on Percocet and tramadol not currently on narcotic pain medicine at this time.  His cough is not productive.  No fevers at home.  CT with pulmonary nodules.  He follows up with his [facility] pulmonologist next week.  Encouraged to let them know.  No tachycardia, tachypnea, hypoxia currently.  Afebrile.  Follow-up outpatient, return for any worsening symptoms.  Symptoms not consistent with sepsis, pneumonia, pneumothorax, fluid overload, PE, dissection.  The patient has been appropriately medically screened and/or stabilized in the ED. I have low suspicion for any other emergent medical condition which would require further screening, evaluation or treatment in the ED or require inpatient management.  Patient is hemodynamically stable and in no acute distress.  Patient able to ambulate in department prior to ED.  Evaluation does not show acute pathology that would require ongoing or additional emergent interventions while in the emergency department or further inpatient treatment.  I have discussed the diagnosis with the patient and answered all questions.  Pain is been managed while in the emergency department and patient has no further complaints prior to discharge.  Patient is comfortable with plan discussed in room and is stable for discharge at this time.  I have discussed strict return precautions for returning to the emergency department.  Patient was encouraged to follow-up with PCP/specialist refer to at discharge.    Medical Decision Making Amount and/or Complexity of Data Reviewed Labs: ordered. Decision-making details documented in ED Course. Radiology: ordered and  independent interpretation  performed. Decision-making details documented in ED Course. ECG/medicine tests:  Decision-making details documented in ED Course.  Risk OTC drugs. Prescription drug management. Parenteral controlled substances. Decision regarding hospitalization. Diagnosis or treatment significantly limited by social determinants of health.          Sequan Auxier A, PA-C 03/22/24 0022    Kammerer, Megan L, DO 03/22/24 1609
